# Patient Record
Sex: Female | Born: 1965 | Race: White | Hispanic: No | Marital: Married | Smoking: Former smoker
Health system: Southern US, Community
[De-identification: ages and names within clinical notes are randomized; demographics above are authoritative.]

## PROBLEM LIST (undated history)

## (undated) DIAGNOSIS — R079 Chest pain, unspecified: Secondary | ICD-10-CM

## (undated) DIAGNOSIS — R519 Headache, unspecified: Secondary | ICD-10-CM

## (undated) DIAGNOSIS — R51 Headache: Secondary | ICD-10-CM

## (undated) DIAGNOSIS — E119 Type 2 diabetes mellitus without complications: Secondary | ICD-10-CM

## (undated) DIAGNOSIS — I10 Essential (primary) hypertension: Secondary | ICD-10-CM

## (undated) HISTORY — PX: LAPAROSCOPIC GASTRIC BANDING: SHX1100

## (undated) HISTORY — PX: CHOLECYSTECTOMY: SHX55

---

## 2013-07-04 ENCOUNTER — Other Ambulatory Visit: Payer: Self-pay | Admitting: Family Medicine

## 2013-07-04 DIAGNOSIS — Z803 Family history of malignant neoplasm of breast: Secondary | ICD-10-CM

## 2013-07-04 DIAGNOSIS — Z1231 Encounter for screening mammogram for malignant neoplasm of breast: Secondary | ICD-10-CM

## 2013-07-20 ENCOUNTER — Inpatient Hospital Stay: Admission: RE | Admit: 2013-07-20 | Payer: Self-pay | Source: Ambulatory Visit

## 2013-12-18 ENCOUNTER — Emergency Department (HOSPITAL_COMMUNITY)
Admission: EM | Admit: 2013-12-18 | Discharge: 2013-12-18 | Disposition: A | Discharge status: Home / Self Care | Payer: 59 | Source: Home / Self Care | Attending: Family Medicine | Admitting: Family Medicine

## 2013-12-18 ENCOUNTER — Emergency Department (INDEPENDENT_AMBULATORY_CARE_PROVIDER_SITE_OTHER): Payer: 59

## 2013-12-18 ENCOUNTER — Encounter (HOSPITAL_COMMUNITY): Payer: Self-pay | Admitting: Emergency Medicine

## 2013-12-18 DIAGNOSIS — S60212A Contusion of left wrist, initial encounter: Secondary | ICD-10-CM
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
## 2013-12-18 NOTE — Discharge Instructions (Signed)
Thank you for coming in today. Take up to 2 Aleve twice daily for pain. Use a wrist brace. Followup with your primary care provider orthopedics in one to 2 weeks for recheck and reevaluation.   Wrist Sprain with Rehab A sprain is an injury in which a ligament that maintains the proper alignment of a joint is partially or completely torn. The ligaments of the wrist are susceptible to sprains. Sprains are classified into three categories. Grade 1 sprains cause pain, but the tendon is not lengthened. Grade 2 sprains include a lengthened ligament because the ligament is stretched or partially ruptured. With grade 2 sprains there is still function, although the function may be diminished. Grade 3 sprains are characterized by a complete tear of the tendon or muscle, and function is usually impaired. SYMPTOMS   Pain tenderness, inflammation, and/or bruising (contusion) of the injury.  A "pop" or tear felt and/or heard at the time of injury.  Decreased wrist function. CAUSES  A wrist sprain occurs when a force is placed on one or more ligaments that is greater than it/they can withstand. Common mechanisms of injury include:  Catching a ball with you hands.  Repetitive and/ or strenuous extension or flexion of the wrist. RISK INCREASES WITH:  Previous wrist injury.  Contact sports (boxing or wrestling).  Activities in which falling is common.  Poor strength and flexibility.  Improperly fitted or padded protective equipment. PREVENTION  Warm up and stretch properly before activity.  Allow for adequate recovery between workouts.  Maintain physical fitness:  Strength, flexibility, and endurance.  Cardiovascular fitness.  Protect the wrist joint by limiting its motion with the use of taping, braces, or splints.  Protect the wrist after injury for 6 to 12 months. PROGNOSIS  The prognosis for wrist sprains depends on the degree of injury. Grade 1 sprains require 2 to 6 weeks of  treatment. Grade 2 sprains require 6 to 8 weeks of treatment, and grade 3 sprains require up to 12 weeks.  RELATED COMPLICATIONS   Prolonged healing time, if improperly treated or re-injured.  Recurrent symptoms that result in a chronic problem.  Injury to nearby structures (bone, cartilage, nerves, or tendons).  Arthritis of the wrist.  Inability to compete in athletics at a high level.  Wrist stiffness or weakness.  Progression to a complete rupture of the ligament. TREATMENT  Treatment initially involves resting from any activities that aggravate the symptoms, and the use of ice and medications to help reduce pain and inflammation. Your caregiver may recommend immobilizing the wrist for a period of time in order to reduce stress on the ligament and allow for healing. After immobilization it is important to perform strengthening and stretching exercises to help regain strength and a full range of motion. These exercises may be completed at home or with a therapist. Surgery is not usually required for wrist sprains, unless the ligament has been ruptured (grade 3 sprain). MEDICATION   If pain medication is necessary, then nonsteroidal anti-inflammatory medications, such as aspirin and ibuprofen, or other minor pain relievers, such as acetaminophen, are often recommended.  Do not take pain medication for 7 days before surgery.  Prescription pain relievers may be given if deemed necessary by your caregiver. Use only as directed and only as much as you need. HEAT AND COLD  Cold treatment (icing) relieves pain and reduces inflammation. Cold treatment should be applied for 10 to 15 minutes every 2 to 3 hours for inflammation and pain and immediately after  any activity that aggravates your symptoms. Use ice packs or massage the area with a piece of ice (ice massage).  Heat treatment may be used prior to performing the stretching and strengthening activities prescribed by your caregiver,  physical therapist, or athletic trainer. Use a heat pack or soak your injury in warm water. SEEK MEDICAL CARE IF:  Treatment seems to offer no benefit, or the condition worsens.  Any medications produce adverse side effects. EXERCISES RANGE OF MOTION (ROM) AND STRETCHING EXERCISES - Wrist Sprain  These exercises may help you when beginning to rehabilitate your injury. Your symptoms may resolve with or without further involvement from your physician, physical therapist or athletic trainer. While completing these exercises, remember:   Restoring tissue flexibility helps normal motion to return to the joints. This allows healthier, less painful movement and activity.  An effective stretch should be held for at least 30 seconds.  A stretch should never be painful. You should only feel a gentle lengthening or release in the stretched tissue. RANGE OF MOTION - Wrist Flexion, Active-Assisted  Extend your right / left elbow with your fingers pointing down.*  Gently pull the back of your hand towards you until you feel a gentle stretch on the top of your forearm.  Hold this position for __________ seconds. Repeat __________ times. Complete this exercise __________ times per day.  *If directed by your physician, physical therapist or athletic trainer, complete this stretch with your elbow bent rather than extended. RANGE OF MOTION - Wrist Extension, Active-Assisted  Extend your right / left elbow and turn your palm upwards.*  Gently pull your palm/fingertips back so your wrist extends and your fingers point more toward the ground.  You should feel a gentle stretch on the inside of your forearm.  Hold this position for __________ seconds. Repeat __________ times. Complete this exercise __________ times per day. *If directed by your physician, physical therapist or athletic trainer, complete this stretch with your elbow bent, rather than extended. RANGE OF MOTION - Supination, Active  Stand  or sit with your elbows at your side. Bend your right / left elbow to 90 degrees.  Turn your palm upward until you feel a gentle stretch on the inside of your forearm.  Hold this position for __________ seconds. Slowly release and return to the starting position. Repeat __________ times. Complete this stretch __________ times per day.  RANGE OF MOTION - Pronation, Active  Stand or sit with your elbows at your side. Bend your right / left elbow to 90 degrees.  Turn your palm downward until you feel a gentle stretch on the top of your forearm.  Hold this position for __________ seconds. Slowly release and return to the starting position. Repeat __________ times. Complete this stretch __________ times per day.  STRETCH - Wrist Flexion  Place the back of your right / left hand on a tabletop leaving your elbow slightly bent. Your fingers should point away from your body.  Gently press the back of your hand down onto the table by straightening your elbow. You should feel a stretch on the top of your forearm.  Hold this position for __________ seconds. Repeat __________ times. Complete this stretch __________ times per day.  STRETCH - Wrist Extension  Place your right / left fingertips on a tabletop leaving your elbow slightly bent. Your fingers should point backwards.  Gently press your fingers and palm down onto the table by straightening your elbow. You should feel a stretch on the inside  of your forearm.  Hold this position for __________ seconds. Repeat __________ times. Complete this stretch __________ times per day.  STRENGTHENING EXERCISES - Wrist Sprain These exercises may help you when beginning to rehabilitate your injury. They may resolve your symptoms with or without further involvement from your physician, physical therapist or athletic trainer. While completing these exercises, remember:   Muscles can gain both the endurance and the strength needed for everyday activities  through controlled exercises.  Complete these exercises as instructed by your physician, physical therapist or athletic trainer. Progress with the resistance and repetition exercises only as your caregiver advises. STRENGTH - Wrist Flexors  Sit with your right / left forearm palm-up and fully supported. Your elbow should be resting below the height of your shoulder. Allow your wrist to extend over the edge of the surface.  Loosely holding a __________ weight or a piece of rubber exercise band/tubing, slowly curl your hand up toward your forearm.  Hold this position for __________ seconds. Slowly lower the wrist back to the starting position in a controlled manner. Repeat __________ times. Complete this exercise __________ times per day.  STRENGTH - Wrist Extensors  Sit with your right / left forearm palm-down and fully supported. Your elbow should be resting below the height of your shoulder. Allow your wrist to extend over the edge of the surface.  Loosely holding a __________ weight or a piece of rubber exercise band/tubing, slowly curl your hand up toward your forearm.  Hold this position for __________ seconds. Slowly lower the wrist back to the starting position in a controlled manner. Repeat __________ times. Complete this exercise __________ times per day.  STRENGTH - Ulnar Deviators  Stand with a ____________________ weight in your right / left hand, or sit holding on to the rubber exercise band/tubing with your opposite arm supported.  Move your wrist so that your pinkie travels toward your forearm and your thumb moves away from your forearm.  Hold this position for __________ seconds and then slowly lower the wrist back to the starting position. Repeat __________ times. Complete this exercise __________ times per day STRENGTH - Radial Deviators  Stand with a ____________________ weight in your  right / left hand, or sit holding on to the rubber exercise band/tubing with your  arm supported.  Raise your hand upward in front of you or pull up on the rubber tubing.  Hold this position for __________ seconds and then slowly lower the wrist back to the starting position. Repeat __________ times. Complete this exercise __________ times per day. STRENGTH - Forearm Supinators  Sit with your right / left forearm supported on a table, keeping your elbow below shoulder height. Rest your hand over the edge, palm down.  Gently grip a hammer or a soup ladle.  Without moving your elbow, slowly turn your palm and hand upward to a "thumbs-up" position.  Hold this position for __________ seconds. Slowly return to the starting position. Repeat __________ times. Complete this exercise __________ times per day.  STRENGTH - Forearm Pronators  Sit with your right / left forearm supported on a table, keeping your elbow below shoulder height. Rest your hand over the edge, palm up.  Gently grip a hammer or a soup ladle.  Without moving your elbow, slowly turn your palm and hand upward to a "thumbs-up" position.  Hold this position for __________ seconds. Slowly return to the starting position. Repeat __________ times. Complete this exercise __________ times per day.  STRENGTH - Grip  Grasp a tennis ball, a dense sponge, or a large, rolled sock in your hand.  Squeeze as hard as you can without increasing any pain.  Hold this position for __________ seconds. Release your grip slowly. Repeat __________ times. Complete this exercise __________ times per day.  Document Released: 02/24/2005 Document Revised: 05/19/2011 Document Reviewed: 06/08/2008 [facility] Patient Information 2015 [place], [place]. This information is not intended to replace advice given to you by your health care provider. Make sure you discuss any questions you have with your health care provider.

## 2014-04-13 ENCOUNTER — Other Ambulatory Visit: Payer: Self-pay | Admitting: Obstetrics & Gynecology

## 2014-04-13 ENCOUNTER — Other Ambulatory Visit (HOSPITAL_COMMUNITY)
Admission: RE | Admit: 2014-04-13 | Discharge: 2014-04-13 | Disposition: A | Discharge status: Home / Self Care | Payer: BLUE CROSS/BLUE SHIELD | Source: Ambulatory Visit | Attending: Obstetrics & Gynecology | Admitting: Obstetrics & Gynecology

## 2014-04-13 DIAGNOSIS — Z1151 Encounter for screening for human papillomavirus (HPV): Secondary | ICD-10-CM | POA: Diagnosis present

## 2014-04-13 DIAGNOSIS — Z01411 Encounter for gynecological examination (general) (routine) with abnormal findings: Secondary | ICD-10-CM | POA: Diagnosis present

## 2014-04-18 LAB — CYTOLOGY - PAP

## 2014-04-25 ENCOUNTER — Other Ambulatory Visit: Payer: Self-pay | Admitting: Obstetrics & Gynecology

## 2014-04-26 ENCOUNTER — Ambulatory Visit
Admission: RE | Admit: 2014-04-26 | Discharge: 2014-04-26 | Disposition: A | Discharge status: Home / Self Care | Payer: BLUE CROSS/BLUE SHIELD | Source: Ambulatory Visit | Attending: Family Medicine | Admitting: Family Medicine

## 2014-04-26 ENCOUNTER — Encounter (INDEPENDENT_AMBULATORY_CARE_PROVIDER_SITE_OTHER): Payer: Self-pay

## 2014-04-26 DIAGNOSIS — Z803 Family history of malignant neoplasm of breast: Secondary | ICD-10-CM

## 2014-04-26 DIAGNOSIS — Z1231 Encounter for screening mammogram for malignant neoplasm of breast: Secondary | ICD-10-CM

## 2014-05-28 NOTE — H&P (Signed)
GYN H&P  HPI  49yo [id number] female who presents for total laparoscopic hysterectomy and bilateral salpingectomy due to abnormal uterine bleeding and history of endometrial hyperplasia. In review, the patient was diagnosed with endometrial hyperplasia that was previously managed with a Mirena. However, over the past few months, she has started to have intermentrual bleeding along with heavier menses.  As part of her management, an [place] was performed that revealed a slightly enlarged uterus measuring 11x7.3x7.2cm with 3 small fibroids (anterior fundal 2.5x2.4x2.3 cm, anterior 2.6x2.4x2.1cm, and fundal 2.6x2.6x2.1cm) normal endometrium, normal ovaries bilaterally. Due to concern for infection (Actinomyces noted on pap smear) and inadequate EMB, the Mirena was removed and a repeat EMB showed no hyperplasia or malignancy. Pt has been on progesterone to control her heavy bleeding with only minimal improvement. After much discussions regarding her options, she wishes to proceed with surgical management.   Current Medications  Taking   Laxative(Sennosides) every 2-3 days   Ambien(Zolpidem Tartrate) 10 MG Tablet 1 tablet at bedtime as needed, Notes: rare   Levothyroxine Sodium 112 Tablet take 1 tablet by mouth every morning on an empty stomach   Losartan Potassium 50 MG Tablet 1 tablet Once a day   MedroxyPROGESTERone Acetate 10 MG Tablet 2 tablets Once a day, Notes: 2 pills/day   Iron Tablet 1 tablet Once a day    Past Medical History  Diabetes Type II  Hypothyroidism   Insomnia  Uterine Hyperplasia  Allergic Rhinitis- cats, pollen   Surgical History  lab band surgery 2009 [place]   gallbladder removal [place] History  Father: deceased 51 yrs, Thyroid cancer  Mother: alive, Colon cancer, HTN, DM, heart "event" 2014, diagnosed with DM, HTN, Colon Ca  Paternal Grand Father: deceased, CAD, Hypertension  Paternal [place] Mother: deceased  Maternal Grand Father: deceased, Hypertension;  cancer  Maternal Grand Mother: deceased, Breast cancer  Sister 1: alive, Healthy  Maternal aunt: deceased, diagnosed with Breast Ca  1 sister(s) .   Maternal Great Aunt had Breast Cancer.   Social History  General:  Tobacco use  cigarettes: Former smoker mid 2015 Tobacco history last updated 05/19/2014 no Smoking.  no Alcohol.  Caffeine: yes, coffee daily.  no Recreational drug use.  Marital Status: Married.    Gyn History  Sexual activity not currently sexually active.  Periods : irregular (see above) LMP Irregular bleeding since removal of Mirena.  Last pap smear date 04/13/2014.  Last mammogram date 04/25/2014.  Abnormal pap smear assessed with colposcopy.    OB History  Pregnancy # 1 abortion.  Pregnancy # 2 miscarriage.    Allergies  N.K.D.A.   Review of Systems  CONSTITUTIONAL:  no Chills. no Fever. no Skin rash.  HEENT:  Blurrred vision no.  CARDIOLOGY:  no Chest pain.  RESPIRATORY:  no Shortness of breath. no Cough.  GASTROENTEROLOGY:  no Abdominal pain. no Appetite change. no Change in bowel movements.  FEMALE REPRODUCTIVE:  no Breast lumps or discharge. no Breast pain. no Unusual vaginal discharge. no Vaginal irritation. no Vaginal itching.  NEUROLOGY:  no Dizziness. no Headache.   Examination (performed in office on 05/23/14)  General Examination: GENERAL APPEARANCE alert, oriented, NAD, pleasant.  SKIN: normal, no rash.  LUNGS: clear to auscultation bilaterally, no wheezes, rhonchi, rales.  HEART: no murmurs, regular rate and rhythm.  ABDOMEN: obese, soft and non-tender, no rebound, no guarding.  FEMALE GENITOURINARY: No external lesions, Vagina - pink moist mucosa, no lesions or abnormal discharge, cervix - visualized,  no discharge or lesions, small clot at cervical os, No CMT. No adnexal masses bilaterally. Uterus: nontender and normal size on palpation.  EXTREMITIES: no edema present, no calf tenderness bilaterally.   A/P: [id number] who  presents for total laparoscopic hysterectomy and bilateral salpingectomy- possible open procedure -NPO -LR @ 125cc/hr -CBC, BMP to be completed -EKG to be performed prior to surgery -SCDs to OR -Cefotan IV to OR -Risk and benefits reviewed, questions and concerns were addressed  Janyth Pupa, DO [phone] (pager) [phone] (office)

## 2014-05-29 ENCOUNTER — Other Ambulatory Visit: Payer: Self-pay

## 2014-05-29 ENCOUNTER — Encounter (HOSPITAL_COMMUNITY)
Admission: RE | Admit: 2014-05-29 | Discharge: 2014-05-29 | Disposition: A | Discharge status: Home / Self Care | Payer: BLUE CROSS/BLUE SHIELD | Source: Ambulatory Visit | Attending: Obstetrics & Gynecology | Admitting: Obstetrics & Gynecology

## 2014-05-29 ENCOUNTER — Encounter (HOSPITAL_COMMUNITY): Payer: Self-pay

## 2014-05-29 DIAGNOSIS — E119 Type 2 diabetes mellitus without complications: Secondary | ICD-10-CM | POA: Diagnosis not present

## 2014-05-29 DIAGNOSIS — I10 Essential (primary) hypertension: Secondary | ICD-10-CM | POA: Diagnosis not present

## 2014-05-29 DIAGNOSIS — Z800 Family history of malignant neoplasm of digestive organs: Secondary | ICD-10-CM | POA: Diagnosis not present

## 2014-05-29 DIAGNOSIS — F17210 Nicotine dependence, cigarettes, uncomplicated: Secondary | ICD-10-CM | POA: Diagnosis not present

## 2014-05-29 DIAGNOSIS — E039 Hypothyroidism, unspecified: Secondary | ICD-10-CM | POA: Diagnosis not present

## 2014-05-29 DIAGNOSIS — D259 Leiomyoma of uterus, unspecified: Secondary | ICD-10-CM | POA: Diagnosis present

## 2014-05-29 DIAGNOSIS — E669 Obesity, unspecified: Secondary | ICD-10-CM | POA: Diagnosis not present

## 2014-05-29 DIAGNOSIS — Z6835 Body mass index (BMI) 35.0-35.9, adult: Secondary | ICD-10-CM | POA: Diagnosis not present

## 2014-05-29 HISTORY — DX: Type 2 diabetes mellitus without complications: E11.9

## 2014-05-29 HISTORY — DX: Essential (primary) hypertension: I10

## 2014-05-29 HISTORY — DX: Headache, unspecified: R51.9

## 2014-05-29 HISTORY — DX: Headache: R51

## 2014-05-29 LAB — CBC
HEMATOCRIT: 34.2 % — AB (ref 36.0–46.0)
Hemoglobin: 11.5 g/dL — ABNORMAL LOW (ref 12.0–15.0)
MCH: 28.1 pg (ref 26.0–34.0)
MCHC: 33.6 g/dL (ref 30.0–36.0)
MCV: 83.6 fL (ref 78.0–100.0)
Platelets: 340 10*3/uL (ref 150–400)
RBC: 4.09 MIL/uL (ref 3.87–5.11)
RDW: 13.9 % (ref 11.5–15.5)
WBC: 7.5 10*3/uL (ref 4.0–10.5)

## 2014-05-29 LAB — BASIC METABOLIC PANEL
Anion gap: 9 (ref 5–15)
BUN: 9 mg/dL (ref 6–23)
CHLORIDE: 104 mmol/L (ref 96–112)
CO2: 22 mmol/L (ref 19–32)
CREATININE: 0.74 mg/dL (ref 0.50–1.10)
Calcium: 9 mg/dL (ref 8.4–10.5)
GFR calc Af Amer: >90 mL/min (ref >90)
Glucose, Bld: 139 mg/dL — ABNORMAL HIGH (ref 70–99)
Potassium: 3.6 mmol/L (ref 3.5–5.1)
Sodium: 135 mmol/L (ref 135–145)

## 2014-05-29 LAB — TYPE AND SCREEN
ABO/RH(D): A POS
ANTIBODY SCREEN: NEGATIVE

## 2014-05-29 LAB — ABO/RH: ABO/RH(D): A POS

## 2014-05-29 NOTE — Patient Instructions (Addendum)
   Your procedure is scheduled on: MARCH 23 AT 730AM  Enter through the Main Entrance of [facility] at: 6AM  Pick up the phone at the desk and dial [phone] and inform us of your arrival.  Please call this number if you have any problems the morning of surgery: [phone]  Remember: Do not eat food after midnight: Do not drink clear liquids after: Take these medicines the morning of surgery with a SIP OF WATER: TAKE SYNTHROID DAY OF SURGERY AND COZAR   Do not wear jewelry, make-up, or FINGER nail polish No metal in your hair or on your body. Do not wear lotions, powders, perfumes.  You may wear deodorant.  Do not bring valuables to the hospital. Contacts, dentures or bridgework may not be worn into surgery.  Leave suitcase in the car. After Surgery it may be brought to your room. For patients being admitted to the hospital, checkout time is 11:00am the day of discharge.    Patients discharged on the day of surgery will not be allowed to drive home.

## 2014-05-30 MED ORDER — DEXTROSE 5 % IV SOLN
2.0000 g | INTRAVENOUS | Status: AC
  Administered 2014-05-31: 2 g via INTRAVENOUS
  Filled 2014-05-30: qty 2

## 2014-05-31 ENCOUNTER — Observation Stay (HOSPITAL_COMMUNITY)
Admission: RE | Admit: 2014-05-31 | Discharge: 2014-06-01 | Disposition: A | Discharge status: Home / Self Care | Payer: BLUE CROSS/BLUE SHIELD | Source: Ambulatory Visit | Attending: Obstetrics & Gynecology | Admitting: Obstetrics & Gynecology

## 2014-05-31 ENCOUNTER — Encounter (HOSPITAL_COMMUNITY)
Admission: RE | Disposition: A | Discharge status: Home / Self Care | Payer: Self-pay | Source: Ambulatory Visit | Attending: Obstetrics & Gynecology

## 2014-05-31 ENCOUNTER — Ambulatory Visit (HOSPITAL_COMMUNITY): Payer: BLUE CROSS/BLUE SHIELD | Admitting: Anesthesiology

## 2014-05-31 ENCOUNTER — Encounter (HOSPITAL_COMMUNITY): Payer: Self-pay | Admitting: Certified Registered Nurse Anesthetist

## 2014-05-31 DIAGNOSIS — N939 Abnormal uterine and vaginal bleeding, unspecified: Secondary | ICD-10-CM | POA: Diagnosis present

## 2014-05-31 DIAGNOSIS — F17210 Nicotine dependence, cigarettes, uncomplicated: Secondary | ICD-10-CM | POA: Insufficient documentation

## 2014-05-31 DIAGNOSIS — E669 Obesity, unspecified: Secondary | ICD-10-CM | POA: Insufficient documentation

## 2014-05-31 DIAGNOSIS — E119 Type 2 diabetes mellitus without complications: Secondary | ICD-10-CM | POA: Insufficient documentation

## 2014-05-31 DIAGNOSIS — D259 Leiomyoma of uterus, unspecified: Principal | ICD-10-CM | POA: Insufficient documentation

## 2014-05-31 DIAGNOSIS — I10 Essential (primary) hypertension: Secondary | ICD-10-CM | POA: Insufficient documentation

## 2014-05-31 DIAGNOSIS — E039 Hypothyroidism, unspecified: Secondary | ICD-10-CM | POA: Insufficient documentation

## 2014-05-31 DIAGNOSIS — Z6835 Body mass index (BMI) 35.0-35.9, adult: Secondary | ICD-10-CM | POA: Insufficient documentation

## 2014-05-31 DIAGNOSIS — Z800 Family history of malignant neoplasm of digestive organs: Secondary | ICD-10-CM | POA: Insufficient documentation

## 2014-05-31 HISTORY — PX: LAPAROSCOPIC BILATERAL SALPINGECTOMY: SHX5889

## 2014-05-31 HISTORY — PX: LAPAROSCOPIC HYSTERECTOMY: SHX1926

## 2014-05-31 LAB — CBC
HCT: 28.9 % — ABNORMAL LOW (ref 36.0–46.0)
Hemoglobin: 9.7 g/dL — ABNORMAL LOW (ref 12.0–15.0)
MCH: 28.6 pg (ref 26.0–34.0)
MCHC: 33.6 g/dL (ref 30.0–36.0)
MCV: 85.3 fL (ref 78.0–100.0)
Platelets: 288 10*3/uL (ref 150–400)
RBC: 3.39 MIL/uL — ABNORMAL LOW (ref 3.87–5.11)
RDW: 13.9 % (ref 11.5–15.5)
WBC: 11.1 10*3/uL — ABNORMAL HIGH (ref 4.0–10.5)

## 2014-05-31 LAB — GLUCOSE, CAPILLARY
GLUCOSE-CAPILLARY: 144 mg/dL — AB (ref 70–99)
Glucose-Capillary: 107 mg/dL — ABNORMAL HIGH (ref 70–99)

## 2014-05-31 SURGERY — Surgical Case
Anesthesia: *Unknown

## 2014-05-31 SURGERY — HYSTERECTOMY, TOTAL, LAPAROSCOPIC
Anesthesia: General | Site: Abdomen

## 2014-05-31 MED ORDER — LACTATED RINGERS IV SOLN
INTRAVENOUS | Status: DC
  Administered 2014-05-31 (×2): via INTRAVENOUS

## 2014-05-31 MED ORDER — HYDROMORPHONE HCL 1 MG/ML IJ SOLN: 0.2000 mg | INTRAMUSCULAR | Status: DC | PRN

## 2014-05-31 MED ORDER — ROCURONIUM BROMIDE 100 MG/10ML IV SOLN
INTRAVENOUS | Status: AC
  Filled 2014-05-31: qty 1

## 2014-05-31 MED ORDER — FENTANYL CITRATE 0.05 MG/ML IJ SOLN
INTRAMUSCULAR | Status: AC
  Filled 2014-05-31: qty 5

## 2014-05-31 MED ORDER — EPHEDRINE SULFATE 50 MG/ML IJ SOLN
INTRAMUSCULAR | Status: DC | PRN
  Administered 2014-05-31 (×2): 10 mg via INTRAVENOUS

## 2014-05-31 MED ORDER — LABETALOL HCL 5 MG/ML IV SOLN
INTRAVENOUS | Status: DC | PRN
  Administered 2014-05-31 (×4): 2.5 mg via INTRAVENOUS

## 2014-05-31 MED ORDER — ONDANSETRON HCL 4 MG PO TABS: 4.0000 mg | ORAL_TABLET | Freq: Four times a day (QID) | ORAL | Status: DC | PRN

## 2014-05-31 MED ORDER — ROCURONIUM BROMIDE 100 MG/10ML IV SOLN
INTRAVENOUS | Status: DC | PRN
  Administered 2014-05-31: 50 mg via INTRAVENOUS
  Administered 2014-05-31 (×6): 10 mg via INTRAVENOUS

## 2014-05-31 MED ORDER — FENTANYL CITRATE 0.05 MG/ML IJ SOLN
INTRAMUSCULAR | Status: DC | PRN
  Administered 2014-05-31 (×2): 50 ug via INTRAVENOUS
  Administered 2014-05-31 (×2): 100 ug via INTRAVENOUS
  Administered 2014-05-31: 50 ug via INTRAVENOUS
  Administered 2014-05-31: 100 ug via INTRAVENOUS
  Administered 2014-05-31: 50 ug via INTRAVENOUS

## 2014-05-31 MED ORDER — SIMETHICONE 80 MG PO CHEW: 80.0000 mg | CHEWABLE_TABLET | Freq: Four times a day (QID) | ORAL | Status: DC | PRN

## 2014-05-31 MED ORDER — ACETAMINOPHEN 160 MG/5ML PO SOLN: 325.0000 mg | ORAL | Status: DC | PRN

## 2014-05-31 MED ORDER — NEOSTIGMINE METHYLSULFATE 10 MG/10ML IV SOLN
INTRAVENOUS | Status: AC
  Filled 2014-05-31: qty 1

## 2014-05-31 MED ORDER — LABETALOL HCL 5 MG/ML IV SOLN
INTRAVENOUS | Status: AC
  Filled 2014-05-31: qty 4

## 2014-05-31 MED ORDER — ONDANSETRON HCL 4 MG/2ML IJ SOLN: 4.0000 mg | Freq: Four times a day (QID) | INTRAMUSCULAR | Status: DC | PRN

## 2014-05-31 MED ORDER — ONDANSETRON HCL 4 MG/2ML IJ SOLN
INTRAMUSCULAR | Status: AC
  Filled 2014-05-31: qty 2

## 2014-05-31 MED ORDER — ROCURONIUM BROMIDE 100 MG/10ML IV SOLN
INTRAVENOUS | Status: AC
Start: 2014-05-31 — End: 2014-05-31
  Filled 2014-05-31: qty 1

## 2014-05-31 MED ORDER — LACTATED RINGERS IV SOLN
INTRAVENOUS | Status: DC
  Administered 2014-05-31 (×4): via INTRAVENOUS

## 2014-05-31 MED ORDER — PNEUMOCOCCAL VAC POLYVALENT 25 MCG/0.5ML IJ INJ
0.5000 mL | INJECTION | INTRAMUSCULAR | Status: DC
  Filled 2014-05-31: qty 0.5

## 2014-05-31 MED ORDER — LEVOTHYROXINE SODIUM 112 MCG PO TABS
112.0000 ug | ORAL_TABLET | Freq: Every day | ORAL | Status: DC
  Administered 2014-06-01: 112 ug via ORAL
  Filled 2014-05-31: qty 1

## 2014-05-31 MED ORDER — ESTRADIOL 0.1 MG/GM VA CREA
TOPICAL_CREAM | VAGINAL | Status: AC
  Filled 2014-05-31: qty 42.5

## 2014-05-31 MED ORDER — MEPERIDINE HCL 25 MG/ML IJ SOLN: 6.2500 mg | INTRAMUSCULAR | Status: DC | PRN

## 2014-05-31 MED ORDER — SCOPOLAMINE 1 MG/3DAYS TD PT72
MEDICATED_PATCH | TRANSDERMAL | Status: AC
  Administered 2014-05-31: 1.5 mg via TRANSDERMAL
  Filled 2014-05-31: qty 1

## 2014-05-31 MED ORDER — DOCUSATE SODIUM 100 MG PO CAPS
100.0000 mg | ORAL_CAPSULE | Freq: Two times a day (BID) | ORAL | Status: DC
  Administered 2014-05-31 – 2014-06-01 (×2): 100 mg via ORAL
  Filled 2014-05-31 (×2): qty 1

## 2014-05-31 MED ORDER — FENTANYL CITRATE 0.05 MG/ML IJ SOLN: 25.0000 ug | INTRAMUSCULAR | Status: DC | PRN

## 2014-05-31 MED ORDER — HYDROMORPHONE HCL 1 MG/ML IJ SOLN
INTRAMUSCULAR | Status: AC
  Filled 2014-05-31: qty 1

## 2014-05-31 MED ORDER — ONDANSETRON HCL 4 MG/2ML IJ SOLN
INTRAMUSCULAR | Status: DC | PRN
  Administered 2014-05-31: 4 mg via INTRAVENOUS

## 2014-05-31 MED ORDER — SCOPOLAMINE 1 MG/3DAYS TD PT72
1.0000 | MEDICATED_PATCH | Freq: Once | TRANSDERMAL | Status: DC
  Administered 2014-05-31: 1.5 mg via TRANSDERMAL

## 2014-05-31 MED ORDER — MENTHOL 3 MG MT LOZG: 1.0000 | LOZENGE | OROMUCOSAL | Status: DC | PRN

## 2014-05-31 MED ORDER — DEXAMETHASONE SODIUM PHOSPHATE 10 MG/ML IJ SOLN
INTRAMUSCULAR | Status: DC | PRN
  Administered 2014-05-31: 4 mg via INTRAVENOUS

## 2014-05-31 MED ORDER — PROPOFOL 10 MG/ML IV BOLUS
INTRAVENOUS | Status: AC
Start: 2014-05-31 — End: 2014-05-31
  Filled 2014-05-31: qty 20

## 2014-05-31 MED ORDER — NEOSTIGMINE METHYLSULFATE 10 MG/10ML IV SOLN
INTRAVENOUS | Status: DC | PRN
  Administered 2014-05-31: 2.5 mg via INTRAVENOUS

## 2014-05-31 MED ORDER — BUPIVACAINE HCL (PF) 0.25 % IJ SOLN
INTRAMUSCULAR | Status: AC
  Filled 2014-05-31: qty 30

## 2014-05-31 MED ORDER — ESTROGENS, CONJUGATED 0.625 MG/GM VA CREA
TOPICAL_CREAM | VAGINAL | Status: DC | PRN
  Administered 2014-05-31: 1 via VAGINAL

## 2014-05-31 MED ORDER — LIDOCAINE HCL (CARDIAC) 20 MG/ML IV SOLN
INTRAVENOUS | Status: AC
  Filled 2014-05-31: qty 5

## 2014-05-31 MED ORDER — LACTATED RINGERS IR SOLN
Status: DC | PRN
  Administered 2014-05-31: 3000 mL

## 2014-05-31 MED ORDER — MIDAZOLAM HCL 2 MG/2ML IJ SOLN: 0.5000 mg | Freq: Once | INTRAMUSCULAR | Status: DC | PRN

## 2014-05-31 MED ORDER — OXYCODONE-ACETAMINOPHEN 5-325 MG PO TABS: 1.0000 | ORAL_TABLET | ORAL | Status: DC | PRN

## 2014-05-31 MED ORDER — ALUM & MAG HYDROXIDE-SIMETH 200-200-20 MG/5ML PO SUSP: 30.0000 mL | ORAL | Status: DC | PRN

## 2014-05-31 MED ORDER — KETOROLAC TROMETHAMINE 30 MG/ML IJ SOLN
30.0000 mg | Freq: Four times a day (QID) | INTRAMUSCULAR | Status: DC | PRN
  Administered 2014-05-31 – 2014-06-01 (×2): 30 mg via INTRAVENOUS
  Filled 2014-05-31 (×2): qty 1

## 2014-05-31 MED ORDER — GLYCOPYRROLATE 0.2 MG/ML IJ SOLN
INTRAMUSCULAR | Status: AC
  Filled 2014-05-31: qty 1

## 2014-05-31 MED ORDER — BISACODYL 5 MG PO TBEC
5.0000 mg | DELAYED_RELEASE_TABLET | Freq: Every day | ORAL | Status: DC | PRN
  Administered 2014-06-01: 5 mg via ORAL
  Filled 2014-05-31 (×2): qty 1

## 2014-05-31 MED ORDER — LACTATED RINGERS IV SOLN
INTRAVENOUS | Status: DC
  Administered 2014-05-31: 06:00:00 via INTRAVENOUS

## 2014-05-31 MED ORDER — LIDOCAINE HCL (CARDIAC) 20 MG/ML IV SOLN
INTRAVENOUS | Status: DC | PRN
  Administered 2014-05-31: 80 mg via INTRAVENOUS

## 2014-05-31 MED ORDER — GLYCOPYRROLATE 0.2 MG/ML IJ SOLN
INTRAMUSCULAR | Status: AC
  Filled 2014-05-31: qty 3

## 2014-05-31 MED ORDER — GLYCOPYRROLATE 0.2 MG/ML IJ SOLN
INTRAMUSCULAR | Status: DC | PRN
  Administered 2014-05-31: 0.4 mg via INTRAVENOUS

## 2014-05-31 MED ORDER — KETOROLAC TROMETHAMINE 30 MG/ML IJ SOLN: 30.0000 mg | Freq: Once | INTRAMUSCULAR | Status: DC | PRN

## 2014-05-31 MED ORDER — ACETAMINOPHEN 325 MG PO TABS: 325.0000 mg | ORAL_TABLET | ORAL | Status: DC | PRN

## 2014-05-31 MED ORDER — ZOLPIDEM TARTRATE 5 MG PO TABS: 5.0000 mg | ORAL_TABLET | Freq: Every evening | ORAL | Status: DC | PRN

## 2014-05-31 MED ORDER — PANTOPRAZOLE SODIUM 40 MG IV SOLR
40.0000 mg | Freq: Every day | INTRAVENOUS | Status: DC
  Administered 2014-05-31: 40 mg via INTRAVENOUS
  Filled 2014-05-31: qty 40

## 2014-05-31 MED ORDER — BUPIVACAINE HCL (PF) 0.25 % IJ SOLN
INTRAMUSCULAR | Status: DC | PRN
  Administered 2014-05-31: 20 mL

## 2014-05-31 MED ORDER — EPHEDRINE 5 MG/ML INJ
INTRAVENOUS | Status: AC
  Filled 2014-05-31: qty 10

## 2014-05-31 MED ORDER — MIDAZOLAM HCL 2 MG/2ML IJ SOLN
INTRAMUSCULAR | Status: AC
  Filled 2014-05-31: qty 2

## 2014-05-31 MED ORDER — MIDAZOLAM HCL 2 MG/2ML IJ SOLN
INTRAMUSCULAR | Status: DC | PRN
  Administered 2014-05-31: 2 mg via INTRAVENOUS

## 2014-05-31 MED ORDER — PROPOFOL 10 MG/ML IV BOLUS
INTRAVENOUS | Status: DC | PRN
Start: 2014-05-31 — End: 2014-05-31
  Administered 2014-05-31: 200 mg via INTRAVENOUS

## 2014-05-31 MED ORDER — KETOROLAC TROMETHAMINE 30 MG/ML IJ SOLN
INTRAMUSCULAR | Status: AC
  Filled 2014-05-31: qty 1

## 2014-05-31 MED ORDER — PROMETHAZINE HCL 25 MG/ML IJ SOLN: 6.2500 mg | INTRAMUSCULAR | Status: DC | PRN

## 2014-05-31 MED ORDER — HYDROMORPHONE HCL 1 MG/ML IJ SOLN
INTRAMUSCULAR | Status: DC | PRN
  Administered 2014-05-31 (×4): 0.5 mg via INTRAVENOUS

## 2014-05-31 MED ORDER — DEXAMETHASONE SODIUM PHOSPHATE 4 MG/ML IJ SOLN
INTRAMUSCULAR | Status: AC
  Filled 2014-05-31: qty 1

## 2014-05-31 SURGICAL SUPPLY — 48 items
APPLICATOR SURGIFLO (MISCELLANEOUS) ×4 IMPLANT
BLADE SURG 10 STRL SS (BLADE) ×8 IMPLANT
CABLE HIGH FREQUENCY MONO STRZ (ELECTRODE) IMPLANT
CLOSURE WOUND 1/4X4 (GAUZE/BANDAGES/DRESSINGS)
CLOTH BEACON ORANGE TIMEOUT ST (SAFETY) ×4 IMPLANT
DISSECTOR BLUNT TIP ENDO 5MM (MISCELLANEOUS) ×4 IMPLANT
DRESSING OPSITE X SMALL 2X3 (GAUZE/BANDAGES/DRESSINGS) ×4 IMPLANT
DRSG COVADERM PLUS 2X2 (GAUZE/BANDAGES/DRESSINGS) ×8 IMPLANT
DRSG OPSITE POSTOP 3X4 (GAUZE/BANDAGES/DRESSINGS) IMPLANT
DURAPREP 26ML APPLICATOR (WOUND CARE) ×8 IMPLANT
ELECT LIGASURE SHORT 9 REUSE (ELECTRODE) IMPLANT
FILTER SMOKE EVAC LAPAROSHD (FILTER) ×4 IMPLANT
GAUZE PACKING 2X5 YD STRL (GAUZE/BANDAGES/DRESSINGS) ×4 IMPLANT
GLOVE BIOGEL PI IND STRL 6.5 (GLOVE) ×8 IMPLANT
GLOVE BIOGEL PI INDICATOR 6.5 (GLOVE) ×8
GLOVE ECLIPSE 6.5 STRL STRAW (GLOVE) ×16 IMPLANT
GOWN STRL REUS W/TWL LRG LVL3 (GOWN DISPOSABLE) ×28 IMPLANT
LIQUID BAND (GAUZE/BANDAGES/DRESSINGS) ×4 IMPLANT
NEEDLE INSUFFLATION 120MM (ENDOMECHANICALS) IMPLANT
OCCLUDER COLPOPNEUMO (BALLOONS) ×4 IMPLANT
PACK LAPAROSCOPY BASIN (CUSTOM PROCEDURE TRAY) ×4 IMPLANT
PACK LAVH (CUSTOM PROCEDURE TRAY) ×4 IMPLANT
PAD OB MATERNITY 4.3X12.25 (PERSONAL CARE ITEMS) ×4 IMPLANT
PAD POSITIONER PINK NONSTERILE (MISCELLANEOUS) ×4 IMPLANT
POUCH SPECIMEN RETRIEVAL 10MM (ENDOMECHANICALS) IMPLANT
PROTECTOR NERVE ULNAR (MISCELLANEOUS) ×4 IMPLANT
SET IRRIG TUBING LAPAROSCOPIC (IRRIGATION / IRRIGATOR) ×8 IMPLANT
SHEARS HARMONIC ACE PLUS 36CM (ENDOMECHANICALS) ×4 IMPLANT
SLEEVE XCEL OPT CAN 5 100 (ENDOMECHANICALS) ×8 IMPLANT
STRIP CLOSURE SKIN 1/4X4 (GAUZE/BANDAGES/DRESSINGS) IMPLANT
SURGIFLO W/THROMBIN 8M KIT (HEMOSTASIS) ×4 IMPLANT
SUT MON AB 4-0 PS1 27 (SUTURE) ×4 IMPLANT
SUT VIC AB 0 CT1 18XCR BRD8 (SUTURE) ×2 IMPLANT
SUT VIC AB 0 CT1 27 (SUTURE) ×4
SUT VIC AB 0 CT1 27XBRD ANBCTR (SUTURE) ×4 IMPLANT
SUT VIC AB 0 CT1 36 (SUTURE) ×8 IMPLANT
SUT VIC AB 0 CT1 8-18 (SUTURE) ×2
SUT VICRYL 0 UR6 27IN ABS (SUTURE) ×4 IMPLANT
SYRINGE 60CC LL (MISCELLANEOUS) ×8 IMPLANT
TIP UTERINE 5.1X6CM LAV DISP (MISCELLANEOUS) IMPLANT
TIP UTERINE 6.7X10CM GRN DISP (MISCELLANEOUS) ×4 IMPLANT
TIP UTERINE 6.7X6CM WHT DISP (MISCELLANEOUS) IMPLANT
TIP UTERINE 6.7X8CM BLUE DISP (MISCELLANEOUS) IMPLANT
TOWEL OR 17X24 6PK STRL BLUE (TOWEL DISPOSABLE) ×20 IMPLANT
TRAY FOLEY CATH 14FR (SET/KITS/TRAYS/PACK) ×4 IMPLANT
TROCAR XCEL NON-BLD 11X100MML (ENDOMECHANICALS) ×4 IMPLANT
TROCAR XCEL NON-BLD 5MMX100MML (ENDOMECHANICALS) ×4 IMPLANT
WARMER LAPAROSCOPE (MISCELLANEOUS) ×4 IMPLANT

## 2014-05-31 NOTE — Anesthesia Preprocedure Evaluation (Signed)
Anesthesia Evaluation  Patient identified by MRN, date of birth, ID band Patient awake    Reviewed: Allergy & Precautions, H&P , Patient's Chart, lab work & pertinent test results, reviewed documented beta blocker date and time   History of Anesthesia Complications Negative for: history of anesthetic complications  Airway Mallampati: III  TM Distance: >3 FB Neck ROM: full    Dental   Pulmonary former smoker,  breath sounds clear to auscultation        Cardiovascular Exercise Tolerance: Good hypertension, Rhythm:regular Rate:Normal     Neuro/Psych  Headaches,    GI/Hepatic   Endo/Other  diabetesMorbid obesity  Renal/GU      Musculoskeletal   Abdominal   Peds  Hematology   Anesthesia Other Findings   Reproductive/Obstetrics                             Anesthesia Physical Anesthesia Plan  ASA: III  Anesthesia Plan: General ETT   Post-op Pain Management:    Induction:   Airway Management Planned:   Additional Equipment:   Intra-op Plan:   Post-operative Plan:   Informed Consent: I have reviewed the patients History and Physical, chart, labs and discussed the procedure including the risks, benefits and alternatives for the proposed anesthesia with the patient or authorized representative who has indicated his/her understanding and acceptance.   Dental Advisory Given  Plan Discussed with: CRNA and Surgeon  Anesthesia Plan Comments:         Anesthesia Quick Evaluation

## 2014-05-31 NOTE — Progress Notes (Signed)
Ur chart review completed.  

## 2014-05-31 NOTE — OR Nursing (Signed)
cbg 205, pt assymptomatic. Reported to Dr. Lyndle Herrlich, no orders received. Darin Engels rn

## 2014-05-31 NOTE — Anesthesia Postprocedure Evaluation (Signed)
  Anesthesia Post-op Note  Patient: Sabrina Rose  Procedure(s) Performed: Procedure(s): HYSTERECTOMY TOTAL LAPAROSCOPIC (N/A) LAPAROSCOPIC BILATERAL SALPINGECTOMY (Bilateral) Patient is awake and responsive. Pain and nausea are reasonably well controlled. Vital signs are stable and clinically acceptable. Oxygen saturation is clinically acceptable. There are no apparent anesthetic complications at this time. Patient is ready for discharge.

## 2014-05-31 NOTE — Interval H&P Note (Signed)
History and Physical Interval Note:  05/31/2014 6:50 AM  Sabrina Rose  has presented today for surgery, with the diagnosis of N93.9 AUB D25.9 Fibroids  The various methods of treatment have been discussed with the patient and family. After consideration of risks, benefits and other options for treatment, the patient has consented to  Procedure(s): HYSTERECTOMY TOTAL LAPAROSCOPIC (N/A) LAPAROSCOPIC BILATERAL SALPINGECTOMY (Bilateral) as a surgical intervention .  The patient's history has been reviewed, patient examined, no change in status, stable for surgery.  I have reviewed the patient's chart and labs.  Questions were answered to the patient's satisfaction.     Janyth Pupa, M

## 2014-05-31 NOTE — Anesthesia Postprocedure Evaluation (Signed)
Anesthesia Post Note  Patient: Sabrina Rose  Procedure(s) Performed: Procedure(s) (LRB): HYSTERECTOMY TOTAL LAPAROSCOPIC (N/A) LAPAROSCOPIC BILATERAL SALPINGECTOMY (Bilateral)  Anesthesia type: General  Patient location: [facility] Unit  Post pain: Pain level controlled  Post assessment: Post-op Vital signs reviewed  Last Vitals:  Filed Vitals:   05/31/14 1751  BP: 107/68  Pulse: 75  Temp: 36.8 C  Resp: 20    Post vital signs: Reviewed  Level of consciousness: sedated  Complications: No apparent anesthesia complications

## 2014-05-31 NOTE — Anesthesia Procedure Notes (Signed)
Procedure Name: Intubation Date/Time: 05/31/2014 7:31 AM Performed by: Raenette Rover Pre-anesthesia Checklist: Patient identified, Emergency Drugs available, Suction available and Patient being monitored Patient Re-evaluated:Patient Re-evaluated prior to inductionOxygen Delivery Method: Circle system utilized Preoxygenation: Pre-oxygenation with 100% oxygen Intubation Type: IV induction Ventilation: Mask ventilation without difficulty and Oral airway inserted - appropriate to patient size Laryngoscope Size: Sabra Heck and 3 Grade View: Grade I Tube type: Oral Tube size: 7.0 mm Number of attempts: 1 Airway Equipment and Method: Stylet Placement Confirmation: ETT inserted through vocal cords under direct vision,  positive ETCO2,  CO2 detector and breath sounds checked- equal and bilateral Secured at: 21 cm Tube secured with: Tape Dental Injury: Teeth and Oropharynx as per pre-operative assessment

## 2014-05-31 NOTE — Op Note (Signed)
OPERATIVE NOTE  Sabrina Rose  DOB:    1965-07-27  MRN:    [id number]  CSN:    [id number]  Date of Surgery:  05/31/2014  Preoperative Diagnosis: Abnormal uterine bleeding, uterine fibroids, h/o endometrial hyperplasia Postoperative Diagnosis: same  Procedure: Total laparoscopic hysterectomy, bilateral salpingectomy Surgeon: Dr. Janyth Pupa  Assistant: Dr. Christophe Louis  Anesthetic: General  Findings:  Enlarged 12wk sized boggy appearing uterus, normal fallopian tubes and ovaries bilaterally.  Minimal bowel adhesions noted in the LLQ to the pelvic side wall.   Uterine weight: 317.2g  EBL: 600cc IVF: 3400cc UOP: 650cc  Procedure:  The patient was taken to the operating room where a general anesthetic was given. The patient's abdomen was prepped with ChloraPrep. The perineum and vagina were prepped with multiple layers of Betadine. A Foley catheter was placed in the bladder.  A sterile speculum was placed.  The anterior lip of the cervix was grasped with a tenaculum.  The uterus was sounded to 12cm.  A 2-0 vicryl was placed at the 12 o'clock position. The RUMI manipulator was placed using the stitch for additional traction.  The patient was draped in a sterile fashion. The infraumbilical area was injected with half percent Marcaine. An incision was made and the Veress needle was inserted into the abdominal cavity without difficulty. Proper placement was confirmed using the saline drop test. A pneumoperitoneum was obtained and opening pressure was 68mmHg. The laparoscopic trocar and the laparoscope were placed under direct visualization and intra-peritoneum placement was confirmed.  Two additional ports were placed in the right and left lower quadrants.  Each area was injected with half percent Marcaine. A small incision was made and a 5 mm trocar was inserted into the abdominal cavity under direct visualization. Pictures taken and findings as described above.  The left round ligament was dissected  using the Harmonic. The left fallopian tube was identified and followed to its fimbriated end and serial ligation of the tube was performed using the Harmonic up to the cornua and removed at that time.  The left utero-ovarian ligament was cauterized and cut.  The anterior leaf of the broad ligament dissected and ligated thereby starting to create the bladder flap anteriorly.  Attention was turned to the right side.  In a similar fashion, the right round ligament was identified and cauterized using the Harmonic.  The right fallopian tube was idenitified and serial ligation was performed from the fimbriated end up to the cornua.  The right utero-ovarian ligament was cauterized and cute.  The anterior leaf of the broad ligament was dissected with the harmonic and the bladder was dissected off the lower uterine segment of the cervix via sharp and blunt dissection.  Bleeding was noted from the right portion of the lower uterine segment- hemostasis was obtained using both the Harmonic and Kleppinger.  Suction/irrigation was used and hemostasis was adequate.  Attention was turned to the left uterine artery, which was skeletonized with the Harmonic, identified and coagulated using both the Harmonic and the Kleppinger forceps.  Additional bleeding was noted and hemostasis obtained using the Kleppinger.  In a similar fashion, the right uterine artery was ligated. The RUMI was palpated and using the Harmonic and the anterior colpotomy was performed.  Using the harmonic a circumferential incision was made around the RUMI using the Harmonic.   Attention was turned vaginally.  Removal of the uterus was attempted, but due to it's large size, the uterus and right fallopian tube were removed in pieces.  Using a scalpel, portions of the uterus were cut and excised in a serial fashion until the remaining portion of the uterus could be delivered vaginally.  A tear was noted along the posterior vaginal wall and repaired using 0  vicryl in a running locked fashion.  The vaginal cuff was closed in a running locked fashion.   Gown and gloves were changed, the pneumoperitoneum was reestablished and attention was turned back to the abdomen.  The pelvis was copiously irrigated and hemostasis was adequate.  Surgicel was placed over the adnexal regions and vaginal cuff.  The 69GE umbilical fascial incision was closed using the fascial closure device under direct visualization using 0-vicryl.  The 5 mm trocars were removed under direct visualization and air was allowed to escape. The 25mm skin incision at the umbilical region was closed with 4-0 monocryl and the 89mm incisions were closed with dermabond.   A vaginal packing was placed at the end of the procedure.  The patient tolerated the procedure well. She was awakened without difficulty and then transported to the recovery room in stable condition. Sponge, needle, and instrument counts were correct.  The uterus and fallopian tubes were sent to pathology.  Dr. Landry Mellow was present throughout the case since residents are not present in our practice and due to the complexity of the case.  She performed surgical management on the right side/portion of the uterus and assisted in retraction/visualization throughout the case.    Janyth Pupa, DO [phone] (pager) [phone] (office)

## 2014-05-31 NOTE — Addendum Note (Signed)
Addendum  created 05/31/14 1802 by Asher Muir, CRNA   Modules edited: Notes Section   Notes Section:  File: [id number]

## 2014-05-31 NOTE — Transfer of Care (Signed)
Immediate Anesthesia Transfer of Care Note  Patient: Sabrina Rose  Procedure(s) Performed: Procedure(s): HYSTERECTOMY TOTAL LAPAROSCOPIC (N/A) LAPAROSCOPIC BILATERAL SALPINGECTOMY (Bilateral)  Patient Location: PACU  Anesthesia Type:General  Level of Consciousness: awake, alert , oriented and patient cooperative  Airway & Oxygen Therapy: Patient Spontanous Breathing and Patient connected to nasal cannula oxygen  Post-op Assessment: Report given to RN and Post -op Vital signs reviewed and stable  Post vital signs: Reviewed and stable  Last Vitals:  Filed Vitals:   05/31/14 0559  BP: 979/15    Complications: No apparent anesthesia complications

## 2014-06-01 DIAGNOSIS — D259 Leiomyoma of uterus, unspecified: Secondary | ICD-10-CM | POA: Diagnosis not present

## 2014-06-01 LAB — CBC
HEMATOCRIT: 22.6 % — AB (ref 36.0–46.0)
Hemoglobin: 7.6 g/dL — ABNORMAL LOW (ref 12.0–15.0)
MCH: 28.5 pg (ref 26.0–34.0)
MCHC: 33.6 g/dL (ref 30.0–36.0)
MCV: 84.6 fL (ref 78.0–100.0)
PLATELETS: 247 10*3/uL (ref 150–400)
RBC: 2.67 MIL/uL — ABNORMAL LOW (ref 3.87–5.11)
RDW: 14.1 % (ref 11.5–15.5)
WBC: 10.9 10*3/uL — ABNORMAL HIGH (ref 4.0–10.5)

## 2014-06-01 LAB — BASIC METABOLIC PANEL
ANION GAP: 6 (ref 5–15)
BUN: 9 mg/dL (ref 6–23)
CO2: 24 mmol/L (ref 19–32)
Calcium: 8 mg/dL — ABNORMAL LOW (ref 8.4–10.5)
Chloride: 107 mmol/L (ref 96–112)
Creatinine, Ser: 0.7 mg/dL (ref 0.50–1.10)
GFR calc Af Amer: >90 mL/min (ref >90)
Glucose, Bld: 107 mg/dL — ABNORMAL HIGH (ref 70–99)
POTASSIUM: 3.9 mmol/L (ref 3.5–5.1)
SODIUM: 137 mmol/L (ref 135–145)

## 2014-06-01 LAB — GLUCOSE, CAPILLARY
GLUCOSE-CAPILLARY: 97 mg/dL (ref 70–99)
Glucose-Capillary: 205 mg/dL — ABNORMAL HIGH (ref 70–99)

## 2014-06-01 MED ORDER — PANTOPRAZOLE SODIUM 40 MG PO TBEC: 40.0000 mg | DELAYED_RELEASE_TABLET | Freq: Every day | ORAL | Status: DC

## 2014-06-01 MED ORDER — DOCUSATE SODIUM 100 MG PO CAPS: 100.0000 mg | ORAL_CAPSULE | Freq: Two times a day (BID) | ORAL | Status: AC

## 2014-06-01 MED ORDER — FERROUS SULFATE 325 (65 FE) MG PO TABS
325.0000 mg | ORAL_TABLET | Freq: Two times a day (BID) | ORAL | Status: DC
  Administered 2014-06-01: 325 mg via ORAL
  Filled 2014-06-01: qty 1

## 2014-06-01 MED ORDER — OXYCODONE-ACETAMINOPHEN 5-325 MG PO TABS: 1.0000 | ORAL_TABLET | Freq: Four times a day (QID) | ORAL | Status: AC | PRN

## 2014-06-01 MED ORDER — FERROUS SULFATE 325 (65 FE) MG PO TABS: 325.0000 mg | ORAL_TABLET | Freq: Two times a day (BID) | ORAL | Status: AC

## 2014-06-01 NOTE — Progress Notes (Signed)
Pt ambulated out teaching complete  

## 2014-06-01 NOTE — Progress Notes (Signed)
The patient is receiving protonix by the intravenous route.  Based on criteria approved by the Pharmacy and [facility], the medication is being converted to the equivalent oral dose form.  These criteria include: -No Active GI bleeding -Able to tolerate diet of full liquids (or better) or tube feeding -Able to tolerate other medications by the oral or enteral route  If you have any questions about this conversion, please contact the Pharmacy Department (ext [phone]).  Thank you.  Beryle Lathe 06/01/2014

## 2014-06-01 NOTE — Progress Notes (Addendum)
Postoperative Day #1  S:  Patient resting comfortable in bed.  Pain controlled.  Tolerating general diet. No flatus, no BM.  Minimal vaginal bleeding, packing and foley removed this am.  Ambulating without difficulty.  She denies n/v/f/c, SOB, or CP.  No acute complaints and overall feeling well.  O: Temp:  [97.4 F (36.3 C)-99.7 F (37.6 C)] 99.7 F (37.6 C) (03/24 0523) Pulse Rate:  [53-75] 72 (03/24 0523) Resp:  [14-24] 18 (03/24 0523) BP: (98-126)/(57-78) 98/57 mmHg (03/24 0523) SpO2:  [98 %-100 %] 98 % (03/24 0523) Weight:  [114.306 kg (252 lb)] 114.306 kg (252 lb) (03/23 1415) Gen: A&Ox3, NAD CV: RRR, no MRG Resp: CTAB Abdomen: soft, NT, ND +BS, umbilical incision with bandage- C/D/I.  Bilateral lower quadrants with dermabond- C/D/I Ext: No edema, no calf tenderness bilaterally  Labs:  CBC Latest Ref Rng 06/01/2014 05/31/2014 05/29/2014  WBC 4.0 - 10.5 K/uL 10.9(H) 11.1(H) 7.5  Hemoglobin 12.0 - 15.0 g/dL 7.6(L) 9.7(L) 11.5(L)  Hematocrit 36.0 - 46.0 % 22.6(L) 28.9(L) 34.2(L)  Platelets 150 - 400 K/uL 247 288 340    A/P: Pt is a 49 y.o. G2P0020 s/p TLH, BS, POD#1  - Pain well controlled -GU: UOP is adequate by foley, pt to void this am -GI: Tolerating general diet -Activity: encouraged sitting up to chair and ambulation as tolerated -Prophylaxis: early ambulation, SCDs while in bed -Heme: Appropriate decline based on EBL, pt asymptomatic, will start on iron twice daily.  Reviewed findings of anemia with patient, will follow as outpatient. -Hypothyroidism- pt to continue with home Levothyroxine -HTN- BP medication on hold and pt to discontinue for the next week -DM- diet controlled, continue with diabetic diet  Meeting postoperative milestones appropriately, plan for discharge home later today.  Sabrina Pupa, DO [phone] (pager) [phone] (office)

## 2014-06-01 NOTE — Discharge Instructions (Signed)
Laparoscopically Hysterectomy, Care After Refer to this sheet in the next few weeks. These instructions provide you with information on caring for yourself after your procedure. Your health care provider may also give you more specific instructions. Your treatment has been planned according to current medical practices, but problems sometimes occur. Call your health care provider if you have any problems or questions after your procedure. WHAT TO EXPECT AFTER THE PROCEDURE After your procedure, it is typical to have the following:  Abdominal pain. You will be given pain medicine to control it.  Sore throat from the breathing tube that was inserted during surgery. HOME CARE INSTRUCTIONS  For pain management: Alternate between Percocet (for moderate/severe pain) and Motrin.  Percocet may cause constipation- so be sure to continue with the Colace twice daily as needed.  You may also use any over the counter medication for constipation as needed.  Do not take aspirin. It can cause bleeding.  Do not drive when taking pain medicine (specifically the Percocet)  Follow your health care provider's advice regarding diet, exercise, lifting, driving, and general activities.  Resume your usual diet as directed and allowed.  Get plenty of rest and sleep.  Do not douche, use tampons, or have sexual intercourse for at least 6 weeks, or until your health care provider gives you permission.  Change your bandages (dressings) as directed by your health care provider.  Monitor your temperature and notify your health care provider of a fever.  Take showers instead of baths for 4 weeks.  Do not drink alcohol until your health care provider gives you permission.  If you develop constipation, you may take a mild laxative with your health care provider's permission. Bran foods may help with constipation problems. Drinking enough fluids to keep your urine clear or pale yellow may help as well.  Try to have  someone home with you for 1-2 weeks to help around the house.  Keep all of your follow-up appointments as directed by your health care provider.  You may return to your normal diabetic diet.    If possible check your blood pressure at home, if higher than 140/90, ok to take blood pressure medication.  Otherwise, you may hold off on your blood pressure medication for the next week.  If you are not sure, please call your physician. SEEK MEDICAL CARE IF:   You have swelling, redness, or increasing pain around your incision sites.  You have pus coming from your incision.  You notice a bad smell coming from your incision.  Your incision breaks open.  You feel dizzy or lightheaded.  You have pain or bleeding when you urinate.  You have persistent diarrhea.  You have persistent nausea and vomiting.  You have abnormal vaginal discharge.  You have a rash.  You have any type of abnormal reaction or develop an allergy to your medicine.  You have poor pain control with your prescribed medicine. SEEK IMMEDIATE MEDICAL CARE IF:   You have a fever.  You have severe abdominal pain.  You have chest pain.  You have shortness of breath.  You faint.  You have pain, swelling, or redness in your leg.  You have heavy vaginal bleeding with blood clots. MAKE SURE YOU:  Understand these instructions.  Will watch your condition.  Will get help right away if you are not doing well or get worse. Document Released: 02/13/2011 Document Revised: 03/01/2013 Document Reviewed: 09/09/2012 [facility] Patient Information 2015 [place], [place]. This information is not intended to  replace advice given to you by your health care provider. Make sure you discuss any questions you have with your health care provider. ° °

## 2014-06-02 ENCOUNTER — Encounter (HOSPITAL_COMMUNITY): Payer: Self-pay | Admitting: Obstetrics & Gynecology

## 2014-06-08 NOTE — Discharge Summary (Signed)
Physician Discharge Summary  Patient ID: Sabrina Rose MRN: [id number] DOB/AGE: 1965/05/24 49 y.o.  Admit date: 05/31/2014 Discharge date: 06/01/2014  Admission Diagnoses: 1) Abnormal uterine bleeding 2) Uterine fibroids 3) h/o endometrial hyperplasia 4) Obesity 5) Hypothyroidism 6) Type II DM  Discharge Diagnoses: same  Discharged Condition: stable  Hospital Course: Sabrina Rose is a 49yo G87P0020 female who presented for total laparoscopic hysterectomy and bilateral salpingectomy due to abnormal uterine bleeding and history of endometrial hyperplasia. In review, the patient was diagnosed with endometrial hyperplasia that was previously managed with a Mirena. However, over the past few months, she has started to have intermentrual bleeding along with heavier menses. As part of her management, an [place] was performed that revealed a slightly enlarged uterus measuring 11x7.3x7.2cm with 3 small fibroids (anterior fundal 2.5x2.4x2.3 cm, anterior 2.6x2.4x2.1cm, and fundal 2.6x2.6x2.1cm) normal endometrium, normal ovaries bilaterally. Due to concern for infection (Actinomyces noted on pap smear) and inadequate EMB, the Mirena was removed and a repeat EMB showed no hyperplasia or malignancy. Pt has been on progesterone to control her heavy bleeding with only minimal improvement. After much discussions regarding her options, she wishes to proceed with surgical management.  The patient underwent a laparoscopic hysterectomy and bilateral salpingectomy on 3/23, for information regarding the surgery, please see the operative report.  There were no complications during the surgery and her postoperative course was uncomplicated.  Though she did have a decline in her Hgb this was expected due to blood loss during surgery.  The patient was asymptomatic and sent home on iron daily.  She met her postoperative milestones appropriately and was discharged home on POD #1 in stable condition.  Consults: None  Significant  Diagnostic Studies: labs:  CBC Latest Ref Rng 06/01/2014 05/31/2014 05/29/2014  WBC 4.0 - 10.5 K/uL 10.9(H) 11.1(H) 7.5  Hemoglobin 12.0 - 15.0 g/dL 7.6(L) 9.7(L) 11.5(L)  Hematocrit 36.0 - 46.0 % 22.6(L) 28.9(L) 34.2(L)  Platelets 150 - 400 K/uL 247 288 340    Treatments: IV hydration, antibiotics: Ancef, analgesia: Toradol, Percocet and surgery: Total laparoscopic hysterectomy, bilateral salpingectomy  Discharge Exam: Blood pressure 98/57, pulse 72, temperature 99.7 F (37.6 C), temperature source Oral, resp. rate 18, height 5' 11"  (1.803 m), weight 114.306 kg (252 lb), SpO2 98 %. Gen: A&Ox3, NAD CV: RRR, no MRG Resp: CTAB Abdomen: soft, NT, ND +BS, umbilical incision with bandage- C/D/I. Bilateral lower quadrants with dermabond- C/D/I Ext: No edema, no calf tenderness bilaterally  Disposition: 01-Home or Self Care     Medication List    TAKE these medications        bisacodyl 5 MG EC tablet  Commonly known as:  DULCOLAX  Take 20 mg by mouth daily as needed for mild constipation or moderate constipation.     docusate sodium 100 MG capsule  Commonly known as:  COLACE  Take 1 capsule (100 mg total) by mouth 2 (two) times daily.     ferrous sulfate 325 (65 FE) MG tablet  Take 1 tablet (325 mg total) by mouth 2 (two) times daily with a meal.     levothyroxine 112 MCG tablet  Commonly known as:  SYNTHROID, LEVOTHROID  Take 112 mcg by mouth daily before breakfast.     losartan 50 MG tablet  Commonly known as:  COZAAR  Take 50 mg by mouth daily.        oxyCODONE-acetaminophen 5-325 MG per tablet  Commonly known as:  PERCOCET/ROXICET  Take 1-2 tablets by mouth every 6 (six) hours as needed  for moderate pain (moderate to severe pain (when tolerating fluids)).     Polyethyl Glycol-Propyl Glycol 0.4-0.3 % Soln  Apply 1-2 drops to eye 3 (three) times daily as needed (For dryness.).           Follow-up Information    Follow up with Janyth Pupa, M, DO In 2 weeks.    Specialty:  Obstetrics and Gynecology   Contact information:   [address] [place] [address] [phone]       Signed: Annalee Genta 06/08/2014, 7:11 AM

## 2016-05-01 ENCOUNTER — Other Ambulatory Visit: Payer: Self-pay | Admitting: Obstetrics & Gynecology

## 2016-05-01 DIAGNOSIS — Z1231 Encounter for screening mammogram for malignant neoplasm of breast: Secondary | ICD-10-CM

## 2016-05-20 ENCOUNTER — Ambulatory Visit: Payer: BLUE CROSS/BLUE SHIELD

## 2016-06-26 ENCOUNTER — Ambulatory Visit
Admission: RE | Admit: 2016-06-26 | Discharge: 2016-06-26 | Disposition: A | Discharge status: Home / Self Care | Payer: Managed Care, Other (non HMO) | Source: Ambulatory Visit | Attending: Obstetrics & Gynecology | Admitting: Obstetrics & Gynecology

## 2016-06-26 DIAGNOSIS — Z1231 Encounter for screening mammogram for malignant neoplasm of breast: Secondary | ICD-10-CM

## 2017-09-04 ENCOUNTER — Other Ambulatory Visit: Payer: Self-pay | Admitting: Obstetrics & Gynecology

## 2017-09-04 DIAGNOSIS — Z1231 Encounter for screening mammogram for malignant neoplasm of breast: Secondary | ICD-10-CM

## 2017-09-28 ENCOUNTER — Ambulatory Visit: Payer: Managed Care, Other (non HMO)

## 2018-01-26 ENCOUNTER — Ambulatory Visit
Admission: RE | Admit: 2018-01-26 | Discharge: 2018-01-26 | Disposition: A | Discharge status: Home / Self Care | Payer: Managed Care, Other (non HMO) | Source: Ambulatory Visit | Attending: Obstetrics & Gynecology | Admitting: Obstetrics & Gynecology

## 2018-01-26 DIAGNOSIS — Z1231 Encounter for screening mammogram for malignant neoplasm of breast: Secondary | ICD-10-CM

## 2018-03-19 DIAGNOSIS — Z1211 Encounter for screening for malignant neoplasm of colon: Secondary | ICD-10-CM | POA: Diagnosis not present

## 2018-03-19 DIAGNOSIS — K5730 Diverticulosis of large intestine without perforation or abscess without bleeding: Secondary | ICD-10-CM | POA: Diagnosis not present

## 2018-03-19 DIAGNOSIS — Z800 Family history of malignant neoplasm of digestive organs: Secondary | ICD-10-CM | POA: Diagnosis not present

## 2018-03-19 DIAGNOSIS — D123 Benign neoplasm of transverse colon: Secondary | ICD-10-CM | POA: Diagnosis not present

## 2018-03-23 DIAGNOSIS — Z1211 Encounter for screening for malignant neoplasm of colon: Secondary | ICD-10-CM | POA: Diagnosis not present

## 2018-03-23 DIAGNOSIS — D123 Benign neoplasm of transverse colon: Secondary | ICD-10-CM | POA: Diagnosis not present

## 2018-05-12 DIAGNOSIS — L02821 Furuncle of head [any part, except face]: Secondary | ICD-10-CM | POA: Diagnosis not present

## 2018-05-12 DIAGNOSIS — L280 Lichen simplex chronicus: Secondary | ICD-10-CM | POA: Diagnosis not present

## 2018-05-12 DIAGNOSIS — L218 Other seborrheic dermatitis: Secondary | ICD-10-CM | POA: Diagnosis not present

## 2018-05-12 DIAGNOSIS — B9689 Other specified bacterial agents as the cause of diseases classified elsewhere: Secondary | ICD-10-CM | POA: Diagnosis not present

## 2018-05-14 DIAGNOSIS — E039 Hypothyroidism, unspecified: Secondary | ICD-10-CM | POA: Diagnosis not present

## 2018-05-14 DIAGNOSIS — F17210 Nicotine dependence, cigarettes, uncomplicated: Secondary | ICD-10-CM | POA: Diagnosis not present

## 2018-05-14 DIAGNOSIS — E1140 Type 2 diabetes mellitus with diabetic neuropathy, unspecified: Secondary | ICD-10-CM | POA: Diagnosis not present

## 2018-05-14 DIAGNOSIS — E785 Hyperlipidemia, unspecified: Secondary | ICD-10-CM | POA: Diagnosis not present

## 2019-04-12 IMAGING — MG DIGITAL SCREENING BILATERAL MAMMOGRAM WITH TOMO AND CAD
8 series · 8 of 24 positions shown · non-contrast
Comparison: Previous exam(s).

CLINICAL DATA: Screening.

EXAM:
DIGITAL SCREENING BILATERAL MAMMOGRAM WITH TOMO AND CAD

[R CC synth-2D]
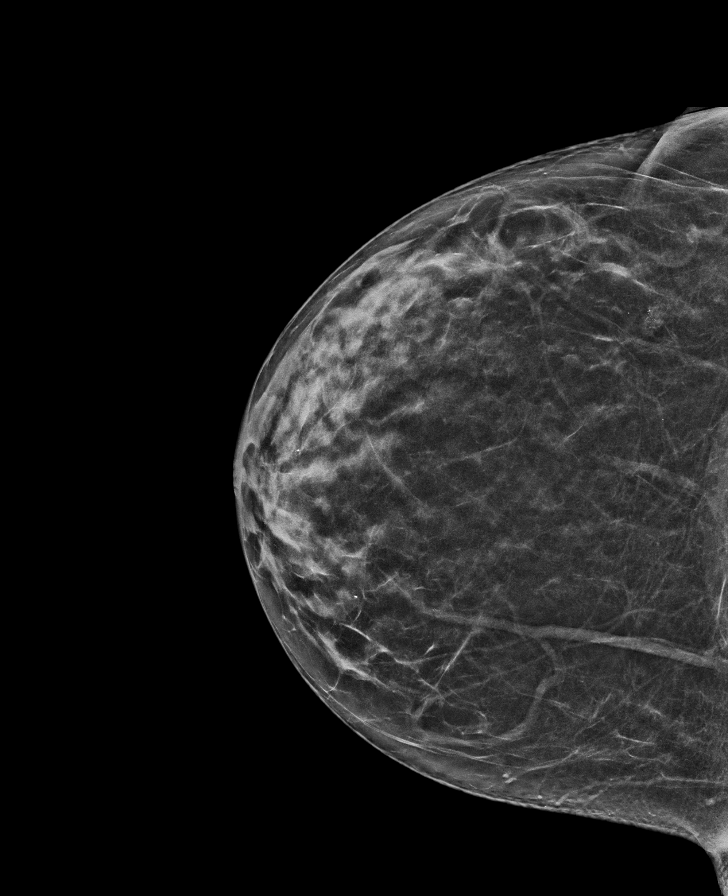

[R MLO synth-2D]
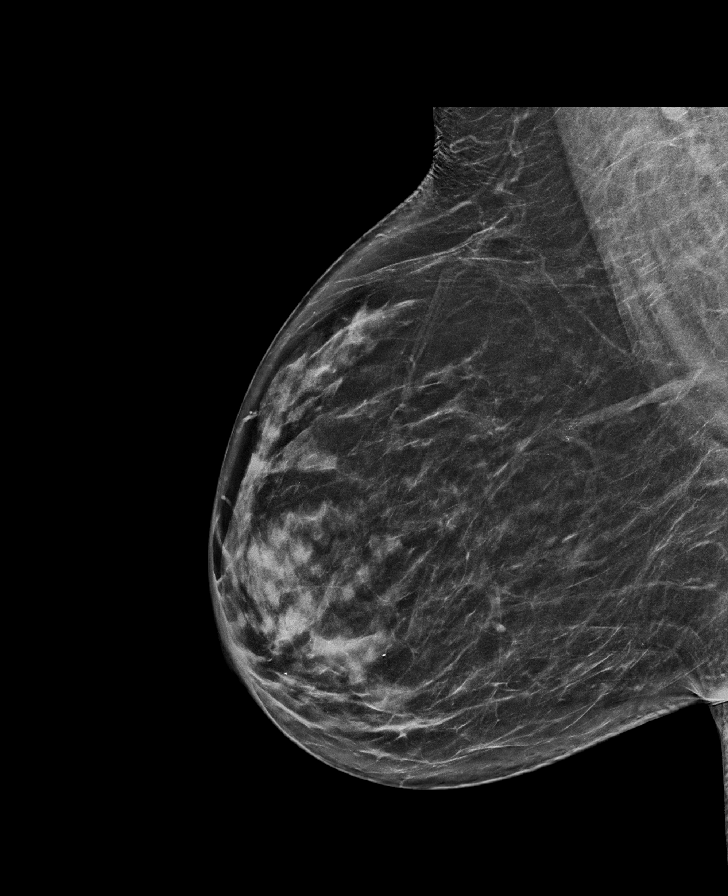

[L CC synth-2D]
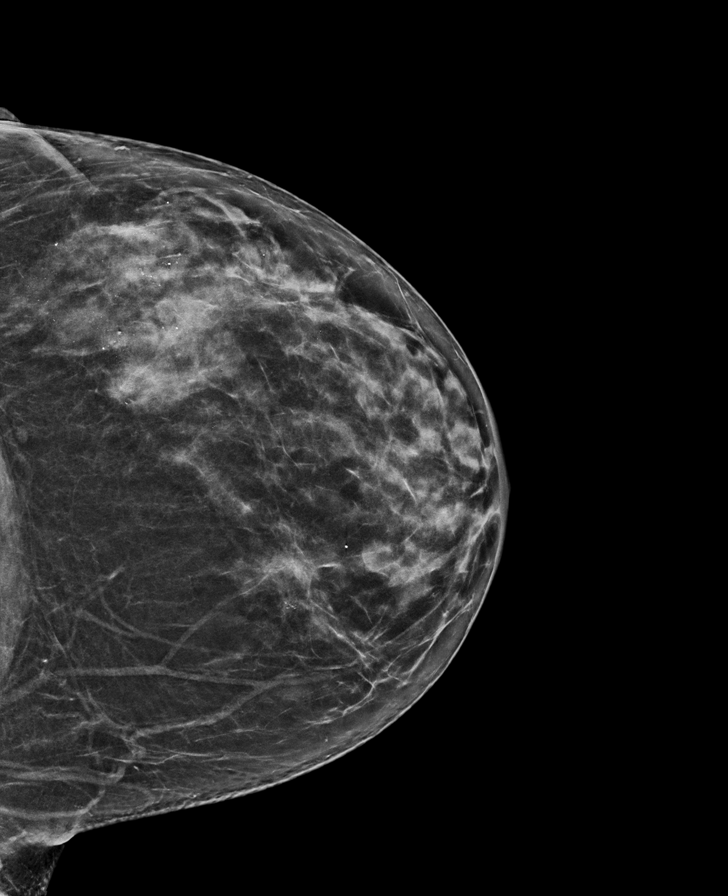

[L MLO synth-2D]
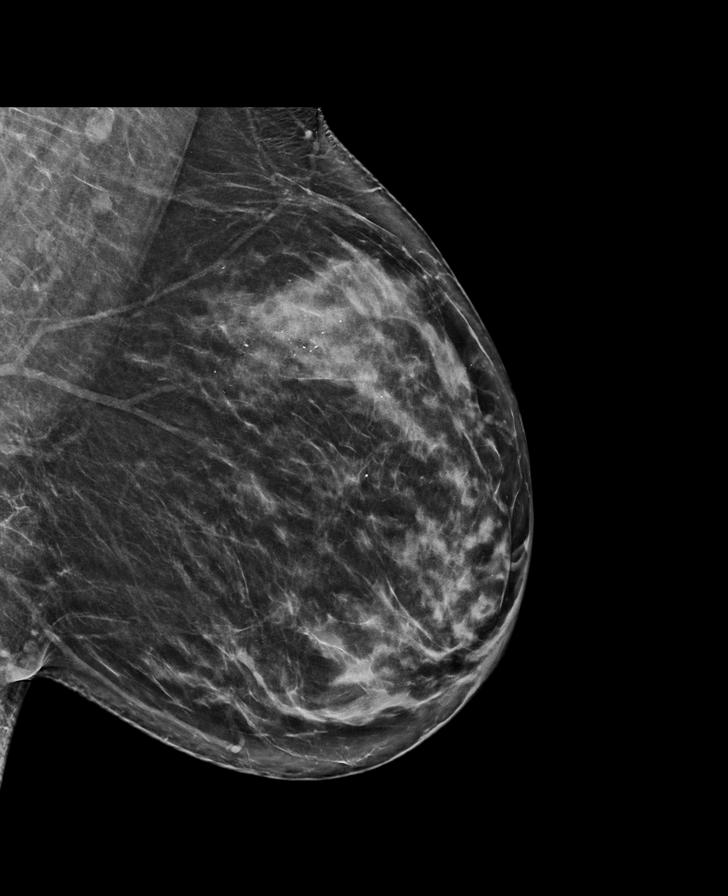

[L MLO tomo · tomo slice 41/82.0]
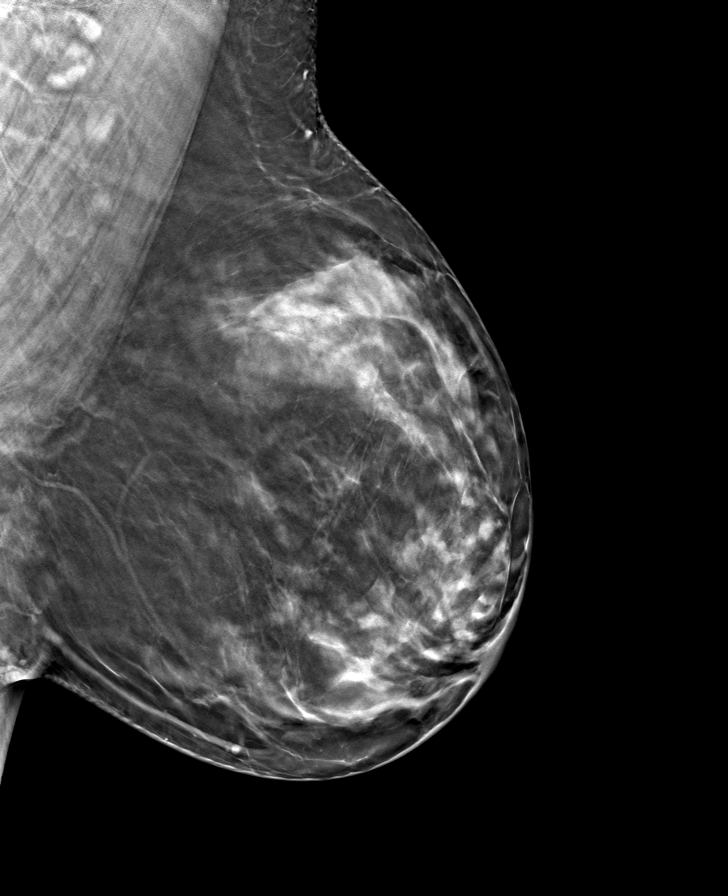

[R CC tomo · tomo slice 35/69.0]
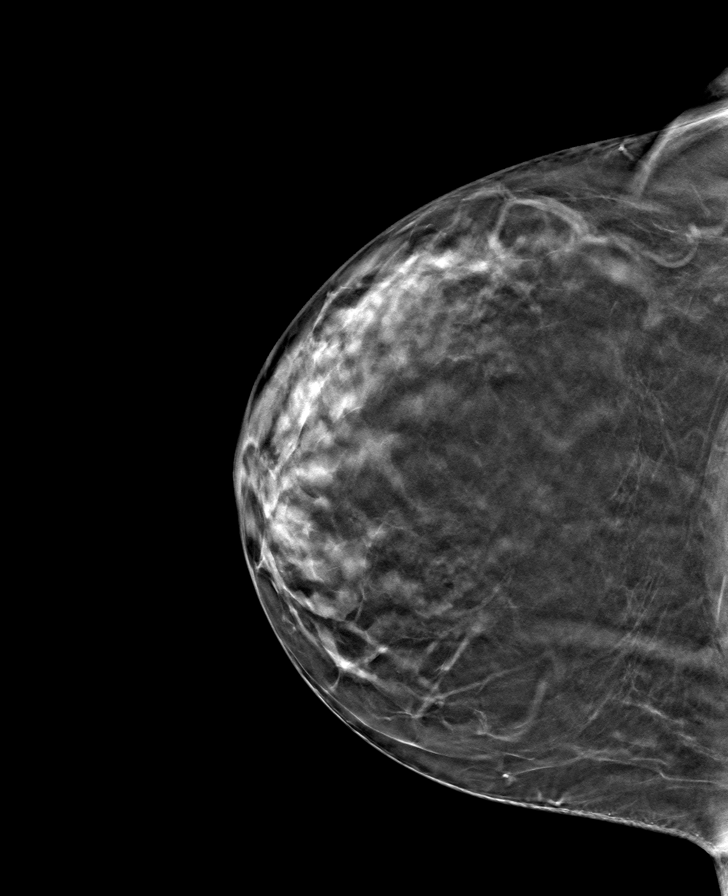

[L CC tomo · tomo slice 37/73.0]
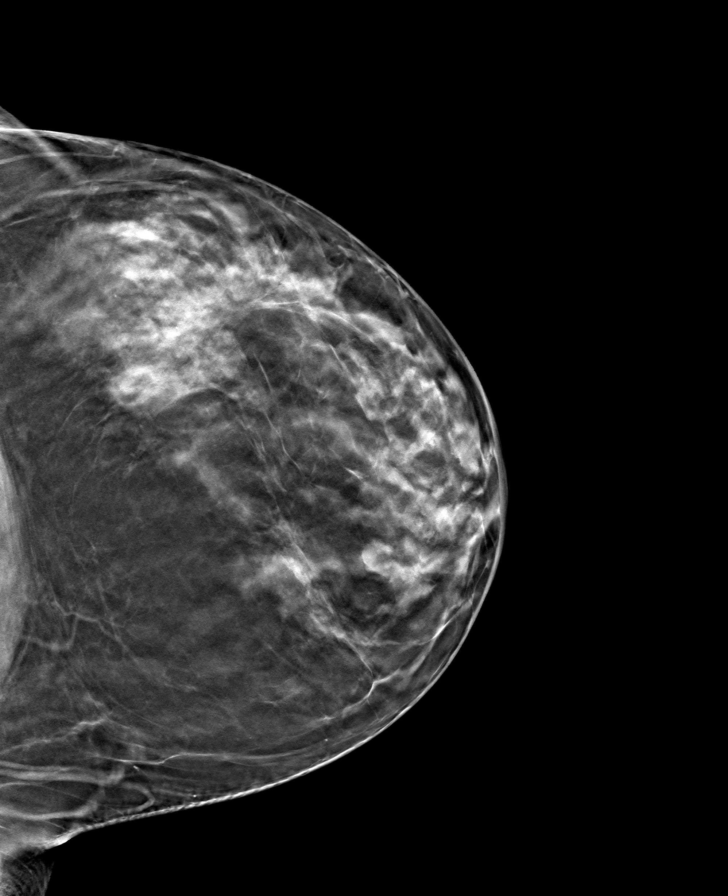

[R MLO tomo · tomo slice 39/77.0]
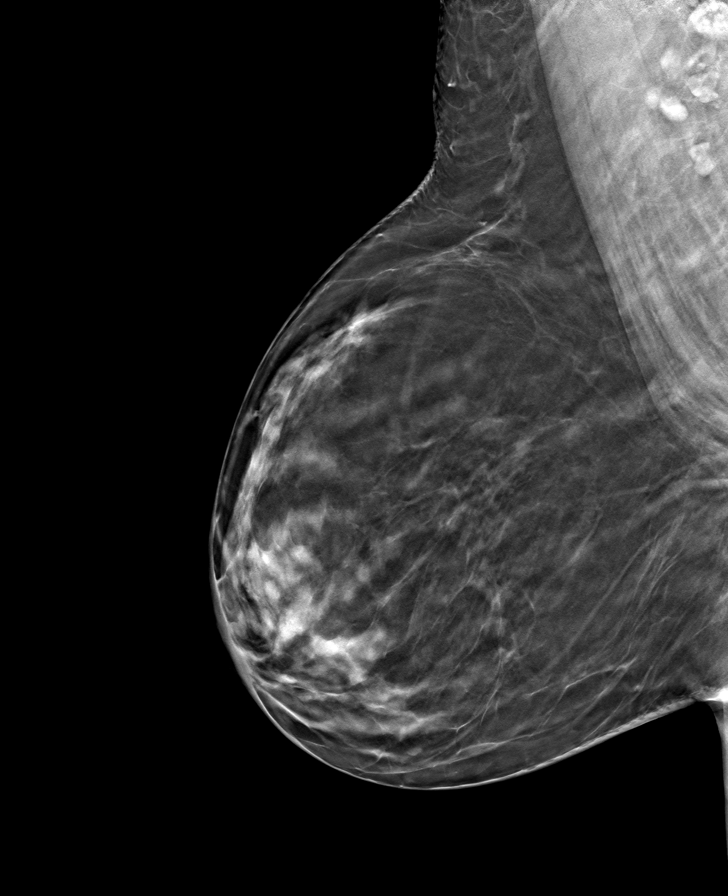

[8 of 24 positions shown; findings below may reference images not displayed]

ACR Breast Density Category c: The breast tissue is heterogeneously
dense, which may obscure small masses.
FINDINGS: There are no findings suspicious for malignancy. Images were
processed with CAD.
IMPRESSION: No mammographic evidence of malignancy. A result letter of this
screening mammogram will be mailed directly to the patient.

RECOMMENDATION:
Screening mammogram in one year. (Code:[id number])

BI-RADS CATEGORY  1: Negative.

## 2021-04-03 ENCOUNTER — Encounter: Attending: Gastroenterology | Primary: Family

## 2021-04-23 ENCOUNTER — Encounter: Attending: Gastroenterology | Primary: Family

## 2021-05-08 ENCOUNTER — Encounter: Attending: Gastroenterology | Primary: Family

## 2021-05-28 ENCOUNTER — Ambulatory Visit
Admit: 2021-05-28 | Discharge: 2021-05-28 | Payer: PRIVATE HEALTH INSURANCE | Attending: Gastroenterology | Primary: Family

## 2021-05-28 DIAGNOSIS — Z86010 Personal history of colonic polyps: Secondary | ICD-10-CM

## 2021-05-28 MED ORDER — SODIUM,POTASSIUM,&MAG SULFATES 17.5G-3.13G-1.6G ORAL SOLN
ORAL | 0 refills | Status: DC
Start: 2021-05-28 — End: 2021-06-04

## 2021-06-04 ENCOUNTER — Inpatient Hospital Stay
Admit: 2021-06-04 | Discharge: 2021-06-04 | Disposition: A | Discharge status: Home / Self Care | Payer: PRIVATE HEALTH INSURANCE | Attending: Emergency Medicine

## 2021-06-04 ENCOUNTER — Emergency Department: Admit: 2021-06-04 | Payer: PRIVATE HEALTH INSURANCE | Primary: Family

## 2021-06-04 DIAGNOSIS — R002 Palpitations: Secondary | ICD-10-CM

## 2021-06-04 DIAGNOSIS — R079 Chest pain, unspecified: Secondary | ICD-10-CM

## 2021-06-04 LAB — COMPREHENSIVE METABOLIC PANEL
ALT: 30 U/L (ref 14–63)
AST: 26 U/L (ref 15–37)
Albumin/Globulin Ratio: 1 (ref 1.0–1.5)
Albumin: 3.7 g/dL (ref 3.4–5.0)
Alkaline Phosphatase: 54 U/L (ref 46–116)
Anion Gap: 8 mmol/L (ref 4–12)
BUN: 13 mg/dL (ref 7–18)
CO2: 28 mmol/L (ref 21.0–32.0)
Calcium: 8.8 mg/dL (ref 8.5–10.1)
Chloride: 106 mmol/L (ref 98–107)
Creatinine: 0.75 mg/dL (ref 0.55–1.02)
GFR African American: >60 mL/min/{1.73_m2} (ref >60)
Globulin: 3.6 g/dL (ref 2.8–3.9)
Glucose: 198 mg/dL — ABNORMAL HIGH (ref 74–106)
Potassium: 4.4 mmol/L (ref 3.5–5.1)
Sodium: 142 mmol/L (ref 136–145)
Total Bilirubin: 0.6 mg/dL (ref 0.2–1.0)
Total Protein: 7.3 g/dL (ref 6.0–8.0)
eGFR NON-AA: >60 mL/min/{1.73_m2} (ref >60)

## 2021-06-04 LAB — CBC WITH AUTO DIFFERENTIAL
Basophils %: 1 % (ref 0.0–3.0)
Basophils Absolute: 0.1 10*3/uL (ref 0.0–0.4)
Eosinophils %: 4 % (ref 0.0–7.0)
Eosinophils Absolute: 0.2 10*3/uL (ref 0.0–1.0)
Granulocyte Absolute Count: 0.1 10*3/uL (ref 0.0–0.17)
Hematocrit: 36.8 % (ref 36.0–47.0)
Hemoglobin: 12.3 g/dL (ref 12.0–16.0)
Immature Granulocytes %: 1 % — ABNORMAL HIGH (ref 0.0–0.5)
Lymphocytes %: 27 % (ref 18.0–40.0)
Lymphocytes Absolute: 1.8 10*3/uL (ref 0.9–4.2)
MCH: 28.6 PG (ref 27.0–35.0)
MCHC: 33.4 g/dL (ref 30.7–37.3)
MCV: 85.6 FL (ref 81.0–94.0)
MPV: 10 FL (ref 9.2–11.8)
Monocytes %: 6 % (ref 2.0–12.0)
Monocytes Absolute: 0.4 10*3/uL (ref 0.1–1.7)
NRBC Absolute: 0 10*3/uL (ref 0.0–0.01)
Neutrophils %: 61 % (ref 48.0–72.0)
Neutrophils Absolute: 4.1 10*3/uL (ref 2.3–7.6)
Nucleated RBCs: 0 PER 100 WBC
Platelets: 249 10*3/uL (ref 130–400)
RBC: 4.3 M/uL (ref 4.20–5.40)
RDW: 13.6 % (ref 11.5–14.0)
WBC: 6.6 10*3/uL (ref 4.8–10.6)

## 2021-06-04 LAB — EKG 12-LEAD
Atrial Rate: 55 {beats}/min
P Axis: 19 degrees
P-R Interval: 144 ms
Q-T Interval: 478 ms
QRS Duration: 74 ms
QTc Calculation (Bazett): 457 ms
R Axis: -10 degrees
T Axis: 18 degrees
Ventricular Rate: 55 {beats}/min

## 2021-06-04 LAB — TROPONIN, HIGH SENSITIVITY
Troponin, High Sensitivity: 13 ng/L (ref 0.0–50.0)
Troponin, High Sensitivity: 11 ng/L (ref 0.0–50.0)

## 2021-06-04 LAB — D-DIMER, QUANTITATIVE: D-Dimer, Quant: 722 ng/mL — ABNORMAL HIGH (ref 0–400)

## 2021-06-04 LAB — BRAIN NATRIURETIC PEPTIDE: BNP: 202 pg/mL — ABNORMAL HIGH (ref 0–100)

## 2021-06-04 LAB — MAGNESIUM
Magnesium: 1.7 mg/dL — ABNORMAL LOW (ref 1.8–2.4)
Magnesium: 1.7 mg/dL — ABNORMAL LOW (ref 1.8–2.4)

## 2021-06-04 LAB — EKG, 12 LEAD, INITIAL
Atrial Rate: 55 {beats}/min
Calculated P Axis: 19 degrees
Calculated R Axis: -10 degrees
Calculated T Axis: 18 degrees
P-R Interval: 144 ms
Q-T Interval: 478 ms
QRS Duration: 74 ms
QTC Calculation (Bezet): 457 ms
Ventricular Rate: 55 {beats}/min

## 2021-06-04 LAB — METABOLIC PANEL, COMPREHENSIVE
A-G Ratio: 1 (ref 1.0–1.5)
ALT (SGPT): 30 U/L (ref 14–63)
AST (SGOT): 26 U/L (ref 15–37)
Albumin: 3.7 g/dL (ref 3.4–5.0)
Alk. phosphatase: 54 U/L (ref 46–116)
Anion gap: 8 mmol/L (ref 4–12)
BUN: 13 mg/dL (ref 7–18)
Bilirubin, total: 0.6 mg/dL (ref 0.2–1.0)
CO2: 28 mmol/L (ref 21.0–32.0)
Calcium: 8.8 mg/dL (ref 8.5–10.1)
Chloride: 106 mmol/L (ref 98–107)
Creatinine: 0.75 mg/dL (ref 0.55–1.02)
GFR est AA: >60 mL/min/{1.73_m2} (ref >60)
GFR est non-AA: >60 mL/min/{1.73_m2} (ref >60)
Globulin: 3.6 g/dL (ref 2.8–3.9)
Glucose: 198 mg/dL — ABNORMAL HIGH (ref 74–106)
Potassium: 4.4 mmol/L (ref 3.5–5.1)
Protein, total: 7.3 g/dL (ref 6.0–8.0)
Sodium: 142 mmol/L (ref 136–145)

## 2021-06-04 LAB — CBC WITH AUTOMATED DIFF
ABS. BASOPHILS: 0.1 10*3/uL (ref 0.0–0.4)
ABS. EOSINOPHILS: 0.2 10*3/uL (ref 0.0–1.0)
ABS. IMM. GRANS.: 0.1 10*3/uL (ref 0.0–0.17)
ABS. LYMPHOCYTES: 1.8 10*3/uL (ref 0.9–4.2)
ABS. MONOCYTES: 0.4 10*3/uL (ref 0.1–1.7)
ABS. NEUTROPHILS: 4.1 10*3/uL (ref 2.3–7.6)
ABSOLUTE NRBC: 0 10*3/uL (ref 0.0–0.01)
BASOPHILS: 1 % (ref 0.0–3.0)
EOSINOPHILS: 4 % (ref 0.0–7.0)
HCT: 36.8 % (ref 36.0–47.0)
HGB: 12.3 g/dL (ref 12.0–16.0)
IMMATURE GRANULOCYTES: 1 % — ABNORMAL HIGH (ref 0.0–0.5)
LYMPHOCYTES: 27 % (ref 18.0–40.0)
MCH: 28.6 pg (ref 27.0–35.0)
MCHC: 33.4 g/dL (ref 30.7–37.3)
MCV: 85.6 fL (ref 81.0–94.0)
MONOCYTES: 6 % (ref 2.0–12.0)
MPV: 10 fL (ref 9.2–11.8)
NEUTROPHILS: 61 % (ref 48.0–72.0)
NRBC: 0 /100{WBCs}
PLATELET: 249 10*3/uL (ref 130–400)
RBC: 4.3 M/uL (ref 4.20–5.40)
RDW: 13.6 % (ref 11.5–14.0)
WBC: 6.6 10*3/uL (ref 4.8–10.6)

## 2021-06-04 LAB — BNP: BNP: 202 pg/mL — ABNORMAL HIGH (ref 0–100)

## 2021-06-04 LAB — TROPONIN-HIGH SENSITIVITY
Troponin-High Sensitivity: 13 ng/L (ref 0.0–50.0)
Troponin-High Sensitivity: 11 ng/L (ref 0.0–50.0)

## 2021-06-04 LAB — D DIMER: D-dimer: 722 ng/mL (D-DU) — ABNORMAL HIGH (ref 0–400)

## 2021-06-04 MED ORDER — ASPIRIN 81 MG CHEWABLE TAB
81 mg | ORAL | Status: AC
Start: 2021-06-04 — End: 2021-06-04
  Administered 2021-06-04: 16:00:00 via ORAL

## 2021-06-04 MED ORDER — SODIUM CHLORIDE 0.9% BOLUS IV
0.9 % | Freq: Once | INTRAVENOUS | Status: AC
Start: 2021-06-04 — End: 2021-06-04
  Administered 2021-06-04: 17:00:00 via INTRAVENOUS

## 2021-06-04 MED ORDER — IOPAMIDOL 76 % IV SOLN
370 mg iodine /mL (76 %) | Freq: Once | INTRAVENOUS | Status: AC
Start: 2021-06-04 — End: 2021-06-04
  Administered 2021-06-04: 17:00:00 via INTRAVENOUS

## 2021-06-04 MED FILL — SODIUM CHLORIDE 0.9 % IV: INTRAVENOUS | Qty: 1000

## 2021-06-04 MED FILL — ISOVUE-370  76 % INTRAVENOUS SOLUTION: 370 mg iodine /mL (76 %) | INTRAVENOUS | Qty: 100

## 2021-06-04 MED FILL — ASPIRIN 81 MG CHEWABLE TAB: 81 mg | ORAL | Qty: 2

## 2021-06-04 NOTE — ED Notes (Signed)
I have reviewed discharge instructions with the patient.  The patient verbalized understanding. Patient ambulated out of the ED with a steady gait.

## 2021-06-04 NOTE — ED Notes (Signed)
 ED MD on phone with cardiologist.

## 2021-06-04 NOTE — ED Notes (Signed)
 Pt to be discharged with cardio follow up tomorrow.

## 2021-06-04 NOTE — ED Notes (Signed)
 Pt ambulatory to ER with C/o increased SOB and Chest pressure X 4 days.  Denies diaphoresis, no vomiting.  (+) dizziness

## 2021-06-04 NOTE — ED Notes (Signed)
 EDMD at bedside

## 2021-06-04 NOTE — ED Notes (Signed)
 Pt states a feeling of chest pressure the past few days but denies pain at this time. Pt states she is in no distress at this time. Pt was sent by [facility] by primary care to get cardiac workup due to her symptoms over the past 4 days.

## 2021-06-04 NOTE — ED Notes (Signed)
Lab contacted for results

## 2021-06-06 ENCOUNTER — Ambulatory Visit
Admit: 2021-06-06 | Discharge: 2021-06-06 | Payer: PRIVATE HEALTH INSURANCE | Attending: Cardiovascular Disease | Primary: Family

## 2021-06-06 ENCOUNTER — Encounter

## 2021-06-06 DIAGNOSIS — E034 Atrophy of thyroid (acquired): Secondary | ICD-10-CM

## 2021-06-06 NOTE — Progress Notes (Signed)
06/06/2021 06/06/21      No referring provider defined for this encounter.     Patient:  Michelle Matthews   DOB:  1965-05-22       CHIEF COMPLAINT:   Chief Complaint   Patient presents with    New Patient     Cardiac evaluation        Dear Dr. Sherlon Handing,    HISTORY OF PRESENT ILLNESS: Today I had the pleasure of seeing your patient, Michelle Matthews in cardiology consultation. As you know, she is a 56 y.o. year old female who  has a past medical history of Chest pain at rest (06/06/2021), Class 2 obesity in adult (06/06/2021), and Hypothyroid. presenting for evaluation of episodes of palpitation and chest pain in addition to shortness of breath which prompted her to go to the emergency room troponins were normal D-dimer was elevated    PAST MEDICAL HISTORY:  Past Medical History:   Diagnosis Date    Chest pain at rest 06/06/2021    Class 2 obesity in adult 06/06/2021    Hypothyroid         PAST SURGICAL HISTORY:  Past Surgical History:   Procedure Laterality Date    HX CHOLECYSTECTOMY      HX PARTIAL HYSTERECTOMY         MEDICATIONS:  Current Outpatient Medications on File Prior to Visit   Medication Sig Dispense Refill    Synthroid 125 mcg tablet       Linzess 290 mcg cap capsule TAKE 1 CAPSULE BY MOUTH EVERY MORNING AS DIRECTED 30 MIN BEFORE BREAKFAST X 90 DAYS      traZODone (DESYREL) 50 mg tablet Take 50 mg by mouth as needed.         No current facility-administered medications on file prior to visit.        ALLERGIES:   No Known Allergies    SOCIAL HISTORY:  reports that she quit smoking about 10 years ago. Her smoking use included cigarettes. She does not have any smokeless tobacco history on file. She reports that she does not currently use alcohol. She reports that she does not currently use drugs.      FAMILY HISTORY: family history includes Breast Cancer in her mother; Colon Cancer in her mother; Diabetes in her mother; Hypertension in her mother; Thyroid Disease in her father.    REVIEW OF SYSTEMS:  Neuro: No headache,  focal weakness/numbness, parasthesias  Eyes: No visual disturbance  ENMT: No soar throat, nasal congestion, hearing loss, tinnitus  CV: No chest pain, palpitations, dizziness, syncope, claudication  Resp: No dyspnea, cough  GI: No abdominal pain, N/V, diarrhea, constipation  GU: No hematuria, dysuria  Skin: No rash  Heme: No bleeding, bruising  Constitutional: No fatigue, weakness, fever, weight change  Musculoskeletal: No myalgias, arthralgias  Other:    PHYSICAL EXAM:  Blood pressure (!) 140/82, pulse 67, height 5\' 11"  (1.803 m), weight 268 lb (121.6 kg), SpO2 98 %.   General: Well developed, well nourished, no acute distress, appears stated age  Eyes: Anicteric, no hemorrhage, normal appearing conjunctivae and lids  ENMT: Normal externally  Neck: Supple, no JVD, no carotid bruit  CV: RRR, normal S1S2, no M/G/R  Lungs: CTA bilaterally, normal respiratory excursion  Abd: Soft, NT, ND, no palpable mass  Extremities: No pedal edema  Skin: Warm and dry  Musculoskeletal: Normal muscle tone, normal gait  Neuro: Nonfocal exam, no rigidity/tremor  Psych: Alert and oriented, appropriate affect    LABS AND  TESTING:  EKG: RSR normal  Labs: reviewed most recent labs  Reviewed outside records    ASSESSMENT AND PLAN:    Chief Complaint   Patient presents with    New Patient     Cardiac evaluation         ICD-10-CM ICD-9-CM    1. Chest pain at rest  R07.9 786.50       2. Class 2 severe obesity due to excess calories with serious comorbidity in adult, unspecified BMI (HCC)  E66.01 278.01         I asked the patient to reduce salt intake try to lose weight have blood test should check thyroid function and D-dimer levels have an echocardiographic study and then event monitor for 2 weeks  Thank you very much for this consultation, I will keep you updated with the results of the above testing.    Sincerely,    Barrett Henle, MD       This report was created using a voice recognition software program. Minor grammatical errors are  common. Please call me if you have any concerns, or if any clarification is needed.

## 2021-06-12 NOTE — Telephone Encounter (Signed)
Spoke to pt she states [employer] will be mailing her new leads for sensitive skin

## 2021-06-12 NOTE — Telephone Encounter (Signed)
I'm wearing a Monitor and I am having some issues. So I called the company and they said to ask you if you had any sensitive skin Patches ?  Call Patient

## 2021-06-13 ENCOUNTER — Encounter

## 2021-06-26 ENCOUNTER — Ambulatory Visit: Admit: 2021-06-26 | Discharge: 2021-06-26 | Payer: PRIVATE HEALTH INSURANCE | Primary: Family

## 2021-06-26 ENCOUNTER — Ambulatory Visit

## 2021-06-26 DIAGNOSIS — R079 Chest pain, unspecified: Secondary | ICD-10-CM

## 2021-06-26 LAB — ECHO (TTE) COMPLETE (PRN CONTRAST/BUBBLE/STRAIN/3D)
AV Area by Peak Velocity: 1.8 cm2
AV Peak Gradient: 16 mmHg
AV Peak Velocity: 2 m/s
AV Velocity Ratio: 0.55
AV Velocity Ratio: 0.56
AVA/BSA Peak Velocity: 0.8 cm2/m2
Ao Root Index: 1.38 cm/m2
Aortic Root: 3.3 cm
Ascending Aorta Index: 1.09 cm/m2
Ascending Aorta: 2.6 cm
EF BP: 71 % (ref 55–100)
Fractional Shortening 2D: 32 % (ref 28–44)
Fractional Shortening 2D: 32.1844 %
IVC Sniffing: 1.5 cm
IVSd: 1 cm — AB (ref 0.6–0.9)
LA Area 2C: 23.4 cm2
LA Area 4C: 26.3 cm2
LA Major Axis: 4.7 cm
LA Volume A-L A4C: 74 mL — AB (ref 22–52)
LA Volume A-L A4C: 95 mL — AB (ref 22–52)
LA Volume Index A-L A2C: 31 mL/m2 (ref 16–34)
LA Volume Index A-L A4C: 40 mL/m2 — AB (ref 16–34)
LV EDV A2C: 105 mL
LV EDV A4C: 118 mL
LV EDV BP: 117 mL — AB (ref 56–104)
LV EDV Index A2C: 44 mL/m2
LV EDV Index A4C: 49 mL/m2
LV EDV Index BP: 49 mL/m2
LV EDV Index Teich: 0 mL/m2
LV EDV Teich: 1 mL
LV ESV A2C: 30.1834 mL
LV ESV A2C: 31 mL
LV ESV A4C: 32.552 mL
LV ESV A4C: 38 mL
LV ESV BP: 34 mL (ref 19–49)
LV ESV Index A2C: 13 mL/m2
LV ESV Index A4C: 16 mL/m2
LV ESV Index BP: 14 mL/m2
LV ESV Index Teich: 0 mL/m2
LV ESV Teich: 0 mL
LV ESV Teich: 28.8122 mL
LV Ejection Fraction A2C: 71 %
LV Ejection Fraction A4C: 68 %
LV IVRT: 73.9 ms
LV Mass 2D Index: 76.1 g/m2 (ref 43–95)
LV Mass 2D: 182 g — AB (ref 67–162)
LV RWT Ratio: 0.4
LVIDd Index: 2.09 cm/m2
LVIDd: 5 cm (ref 3.9–5.3)
LVIDs Index: 1.42 cm/m2
LVIDs: 3.4 cm
LVOT Area: 3.1 cm2
LVOT Diameter: 2 cm
LVOT Peak Gradient: 5 mmHg
LVOT Peak Velocity: 1.1 m/s
LVPWd: 1 cm — AB (ref 0.6–0.9)
Left Ventricular Stroke Volume by 2-D Biplane-MOD: 33.5999 mL
MV A Velocity: 0.86 m/s
MV E Velocity: 0.91 m/s
MV E Wave Deceleration Time: 218.7 ms
MV E/A: 1.06
Mitral Valve E-F Slope by M-mode: 4.1781
TR Max Velocity: 2.57 m/s
TR Peak Gradient: 26 mmHg

## 2021-06-26 LAB — ECHO ADULT COMPLETE
AV Area by Peak Velocity: 1.8 cm2
AV Peak Gradient: 16 mmHg
AV Peak Velocity: 2 m/s
AV Velocity Ratio: 0.55
AV Velocity Ratio: 0.56
AVA/BSA Peak Velocity: 0.8 cm2/m2
Ao Root Index: 1.38 cm/m2
Ascending Aorta Index: 1.09 cm/m2
Ascending Aorta: 2.6 cm
EF BP: 71 % (ref 55–100)
Fractional Shortening 2D: 32 % (ref 28–44)
IVC Sniffing: 1.5 cm
IVSd: 1 cm — AB (ref 0.6–0.9)
LA Area 2C: 23.4 cm2
LA Area 4C: 26.3 cm2
LA Major Axis: 4.7 cm
LA Volume 2C: 74 mL — AB (ref 22–52)
LA Volume 4C: 95 mL — AB (ref 22–52)
LA Volume Index 2C: 31 mL/m2 (ref 16–34)
LA Volume Index 4C: 40 mL/m2 — AB (ref 16–34)
LV EDV A2C: 105 mL
LV EDV A4C: 118 mL
LV EDV BP: 117 mL — AB (ref 56–104)
LV EDV Index A2C: 44 mL/m2
LV EDV Index A4C: 49 mL/m2
LV EDV Index BP: 49 mL/m2
LV EDV Index Teich: 0 mL/m2
LV EDV Teich: 1 mL
LV ESV A2C: 31 mL
LV ESV A4C: 38 mL
LV ESV BP: 34 mL (ref 19–49)
LV ESV Index A2C: 13 mL/m2
LV ESV Index A4C: 16 mL/m2
LV ESV Index BP: 14 mL/m2
LV ESV Index Teich: 0 mL/m2
LV ESV Teich: 0 mL
LV Ejection Fraction A2C: 71 %
LV Ejection Fraction A4C: 68 %
LV IVRT: 73.9 ms
LV Mass 2D Index: 76.1 g/m2 (ref 43–95)
LV Mass 2D: 182 g — AB (ref 67–162)
LV RWT Ratio: 0.4
LVIDd Index: 2.09 cm/m2
LVIDd: 5 cm (ref 3.9–5.3)
LVIDs Index: 1.42 cm/m2
LVIDs: 3.4 cm
LVOT Area: 3.1 cm2
LVOT Diameter: 2 cm
LVOT Peak Gradient: 5 mmHg
LVOT Peak Velocity: 1.1 m/s
LVPWd: 1 cm — AB (ref 0.6–0.9)
Left Ventricular Fractional Shortening by 2D: 32.1844 %
Left Ventricular Stroke Volume by 2-D Biplane-MOD: 33.5999 mL
Left Ventricular Stroke Volume by 2-D Single Plane- MOD: 30.1834 mL
Left Ventricular Stroke Volume by 2-D Single Plane- MOD: 32.552 mL
Left Ventricular Stroke Volume by Teichholz Method: 28.8122 mL
MV A Velocity: 0.86 m/s
MV E Velocity: 0.91 m/s
MV E Wave Deceleration Time: 218.7 ms
MV E/A: 1.06
Mitral Valve Deceleration Slope: 4.1781
TR Max Velocity: 2.57 m/s
TR Peak Gradient: 26 mmHg

## 2021-07-04 ENCOUNTER — Encounter: Attending: Cardiovascular Disease | Primary: Family

## 2021-07-08 MED ORDER — LIDOCAINE HCL 2 % (20 MG/ML) IJ SOLN
20 mg/mL (2 %) | INTRAMUSCULAR | Status: AC
Start: 2021-07-08 — End: ?

## 2021-07-08 MED ORDER — MIDAZOLAM 1 MG/ML IJ SOLN
1 mg/mL | INTRAMUSCULAR | Status: DC | PRN
Start: 2021-07-08 — End: 2021-07-08
  Administered 2021-07-08: 14:00:00 via INTRAVENOUS

## 2021-07-08 MED ORDER — LACTATED RINGERS IV
INTRAVENOUS | Status: DC
Start: 2021-07-08 — End: 2021-07-08
  Administered 2021-07-08: 13:00:00 via INTRAVENOUS

## 2021-07-08 MED ORDER — PROPOFOL 10 MG/ML IV EMUL
10 mg/mL | INTRAVENOUS | Status: AC
Start: 2021-07-08 — End: ?

## 2021-07-08 MED ORDER — MIDAZOLAM 1 MG/ML IJ SOLN
1 mg/mL | INTRAMUSCULAR | Status: AC
Start: 2021-07-08 — End: ?

## 2021-07-08 MED ORDER — PROPOFOL 10 MG/ML IV EMUL
10 mg/mL | INTRAVENOUS | Status: DC | PRN
Start: 2021-07-08 — End: 2021-07-08
  Administered 2021-07-08 (×5): via INTRAVENOUS

## 2021-07-08 MED FILL — LIDOCAINE HCL 2 % (20 MG/ML) IJ SOLN: 20 mg/mL (2 %) | INTRAMUSCULAR | Qty: 20

## 2021-07-08 MED FILL — DIPRIVAN 10 MG/ML INTRAVENOUS EMULSION: 10 mg/mL | INTRAVENOUS | Qty: 40

## 2021-07-08 MED FILL — LACTATED RINGERS IV: INTRAVENOUS | Qty: 1000

## 2021-07-08 MED FILL — DIPRIVAN 10 MG/ML INTRAVENOUS EMULSION: 10 mg/mL | INTRAVENOUS | Qty: 90

## 2021-07-08 MED FILL — MIDAZOLAM 1 MG/ML IJ SOLN: 1 mg/mL | INTRAMUSCULAR | Qty: 2

## 2021-07-08 NOTE — Procedures (Signed)
Procedures by Thyra Breed, MD at 07/08/21 1035                Author: Thyra Breed, MD  Service: Gastroenterology  Author Type: Physician       Filed: 07/08/21 1038  Date of Service: 07/08/21 1035  Status: Signed          Editor: Thyra Breed, MD (Physician)                             Operative Report          Patient: Michelle Matthews  MRN: [id number]   SSN: [id number]          Date of Birth: 1965/04/11   Age: 56 y.o.   Sex: female          Date of Surgery: 07/08/2021      Preoperative Diagnosis: History of colon polyps [Z86.010]   Family history of colon cancer [Z80.0]   Other constipation [K59.09]       Postoperative Diagnosis: diverticulosis, colon polyps       Surgeon(s) and Role:      * Samule Life, MD - Primary      Anesthesia: MAC       Procedure: Procedure(s):   COLONOSCOPY, ileoscopy, polypectomy    Indication: hx of colon polyps, family hx of colon cancer   Procedure in Detail: the endoscope was advanced to the cecum, verified by visualization of the ileocecal valve and the appendiceal orifice.   The terminal ileum was intubated for a short distance with endoscopically normal mucosa seen.  The following polyps were resected with the jumbo biopsy forceps:      Descending: 71m    Sigmoid: 245m     Scattered left sided diverticula were present. Small internal hemorrhoids were seen on retroflexion in the rectum. The bowel prep was good.       Assessment: 1. S/p resection of two small polyps                         2. Diverticulosis                         .3. small internal hemorrhoids      Plan: 1 follow up biopsy results.          Estimated Blood Loss:  negligible.       Tourniquet Time: * No tourniquets in log *        Implants: * No implants in log *              Specimens:            ID  Type  Source  Tests  Collected by  Time  Destination     A : descending colon polyp  Preservative  Colon, Descending    Dylan Monforte, MD  07/08/2021 10:28 AM  Pathology              B : sigmoid colon polyp  Preservative  Sigmoid    Ransom Nickson, MD  07/08/2021 10:29 AM  Pathology               Drains: None                  Complications: None      Counts: Sponge and needle counts were correct times two.  Signed By:   Thyra Breed, MD           Jul 08, 2021

## 2021-07-08 NOTE — H&P (Signed)
56 yr old female with family hx of colon cancer.. pt with hx of colon polyps.  See notes for details   Vs reviewed  Heent; perl, anicteric   Heart: +s1s2rr  Lungs; cta b/l  Abd: +bs,soft, nt/nd    Plan; colonoscopy     Previous h and p from 05/2021 as follows; "  56 yr old female with family hx of colon cancer.. pt with hx of colon polyps, told were large and to repeat soon.  Last colonoscopy was around end of 2020.  No n/v/d. No melena or hematochezia.  Hx of lap band surgery and uses linzess for constipation. No gerd. No dysphagia or odynophagia. No cp. Recent sob from respiratory infection but improving. No abd pain     There is no problem list on file for this patient.           No past medical history on file.  No past surgical history on file.  Not on File     No family history on file.  Social History            Socioeconomic History    Marital status: UNKNOWN       Spouse name: Not on file    Number of children: Not on file    Years of education: Not on file    Highest education level: Not on file   Occupational History    Not on file   Tobacco Use    Smoking status: Not on file    Smokeless tobacco: Not on file   Substance and Sexual Activity    Alcohol use: Not on file    Drug use: Not on file    Sexual activity: Not on file   Other Topics Concern    Not on file   Social History Narrative    Not on file      Social Determinants of Health      Financial Resource Strain: Not on file   Food Insecurity: Not on file   Transportation Needs: Not on file   Physical Activity: Not on file   Stress: Not on file   Social Connections: Not on file   Intimate Partner Violence: Not on file   Housing Stability: Not on file         Visit Vitals  BP 136/86   Pulse 84   Temp 98.3 F (36.8 C)   Ht 5\' 11"  (1.803 m)   Wt 268 lb (121.6 kg)   SpO2 98%   BMI 37.38 kg/m         REVIEW OF SYSTEMS:  Review of Systems   Cardiovascular:  Negative for chest pain.   Gastrointestinal:  Negative for abdominal pain, nausea and vomiting.          Objective:      PHYSICAL EXAM:  Physical Exam  Constitutional:       Appearance: She is well-developed.   HENT:      Head: Normocephalic and atraumatic.   Eyes:      General: No scleral icterus.     Conjunctiva/sclera: Conjunctivae normal.      Pupils: Pupils are equal, round, and reactive to light.   Neck:      Thyroid: No thyromegaly.   Cardiovascular:      Rate and Rhythm: Normal rate and regular rhythm.      Heart sounds: Normal heart sounds. No murmur heard.  Pulmonary:      Effort: Pulmonary effort is normal. No respiratory  distress.      Breath sounds: Normal breath sounds. No wheezing or rales.   Abdominal:      General: Bowel sounds are normal. There is no distension.      Palpations: Abdomen is soft. There is no mass.      Tenderness: There is no abdominal tenderness. There is no guarding or rebound.   Musculoskeletal:      Cervical back: Neck supple.   Skin:     General: Skin is warm and dry.   Neurological:      Mental Status: She is alert and oriented to person, place, and time.   Psychiatric:         Mood and Affect: Mood normal.         Behavior: Behavior normal.            No results found for this or any previous visit.      Assessment/Plan:   Discussed the patient's BMI with her.  I have recommended the following interventions: dietary management education, guidance, and counseling.       The patient was counseled on the dangers of tobacco use.      I attest that current meds have been reviewed and are accurate.    56 yr old female with hx of colon polyps, family hx of colon cancer  --schedule colonoscopy -take linzess daily for 5 days prior to colonoscopy.

## 2021-07-08 NOTE — Anesthesia Post-Procedure Evaluation (Signed)
Procedure(s):  COLONOSCOPY, ileoscopy, polypectomy.    MAC    Anesthesia Post Evaluation      Multimodal analgesia: multimodal analgesia used between 6 hours prior to anesthesia start to PACU discharge  Patient location during evaluation: PACU  Patient participation: complete - patient participated  Level of consciousness: awake  Pain management: adequate  Airway patency: patent  Anesthetic complications: no  Cardiovascular status: acceptable  Respiratory status: acceptable  Hydration status: acceptable  Comments: Pt doing well w/o complaint  Area of IV infiltration resolved        INITIAL Post-op Vital signs:   Vitals Value Taken Time   BP 124/74 07/08/21 1100   Temp 36.4 C (97.6 F) 07/08/21 1040   Pulse 54 07/08/21 1101   Resp 12 07/08/21 1101   SpO2 95 % 07/08/21 1101   Vitals shown include unvalidated device data.

## 2021-07-08 NOTE — Anesthesia Pre-Procedure Evaluation (Signed)
Anesthesia Preprocedure Evaluation by Beverlee Nims, MD at 07/08/21 1005                Author: Beverlee Nims, MD  Service: ANESTHESIOLOGY  Author Type: Physician       Filed: 07/08/21 1005  Date of Service: 07/08/21 1005  Status: Signed          Editor: Beverlee Nims, MD (Physician)                 Relevant Problems       No relevant active problems           Anesthetic History    No history of anesthetic complications                 Review of Systems / Medical History   Patient summary reviewed          Pulmonary   Within defined limits                          Neuro/Psych    Within defined limits                 Cardiovascular   Within defined limits                        Exercise tolerance: >4 METS            GI/Hepatic/Renal   Within defined limits                     Endo/Other         Hypothyroidism   Morbid obesity        Other Findings                     Physical Exam          Airway   Mallampati: II   TM Distance: 4 - 6 cm   Neck ROM: normal range of motion    Mouth opening: Normal        Cardiovascular   Regular rate and rhythm,  S1 and S2 normal,  no murmur, click, rub, or gallop                    Dental   No notable dental hx               Pulmonary   Breath sounds clear to auscultation                       Abdominal    GI exam deferred           Other Findings                  Anesthetic Plan      ASA: 2   Anesthesia type: MAC               Induction: Intravenous   Anesthetic plan and risks discussed with: Patient

## 2021-07-10 ENCOUNTER — Ambulatory Visit
Admit: 2021-07-10 | Discharge: 2021-07-10 | Payer: PRIVATE HEALTH INSURANCE | Attending: Cardiovascular Disease | Primary: Family

## 2021-07-10 ENCOUNTER — Ambulatory Visit: Attending: Cardiovascular Disease | Primary: Family

## 2021-07-10 DIAGNOSIS — R0609 Other forms of dyspnea: Secondary | ICD-10-CM

## 2021-07-10 NOTE — Progress Notes (Signed)
07/10/2021     Patient:  Michelle Matthews   DOB:  1965-07-09       CHIEF COMPLAINT:   Chief Complaint   Patient presents with    Hypertension    Results         HISTORY OF PRESENT ILLNESS: Michelle Matthews presents for cardiology follow up.  Labile hypertension follow-up on test results    PAST MEDICAL HISTORY:  Past Medical History:   Diagnosis Date    Chest pain at rest 06/06/2021    Class 2 obesity in adult 06/06/2021    Constipation     Dyspnea on exertion 06/06/2021    H/O laparoscopic adjustable gastric banding     Hypothyroid     Palpitation 06/06/2021        PAST SURGICAL HISTORY:  Past Surgical History:   Procedure Laterality Date    COLONOSCOPY N/A 07/08/2021    COLONOSCOPY, ileoscopy, polypectomy performed by Verdie Drown, MD at [facility] MAIN OR    HX BARIATRIC SURGERY      lap band placement    HX CHOLECYSTECTOMY      HX COLONOSCOPY      HX PARTIAL HYSTERECTOMY         MEDICATIONS:  Current Outpatient Medications   Medication Sig Dispense Refill    metFORMIN (GLUCOPHAGE) 500 mg tablet TAKE 1 TABLET BY MOUTH EVERY DAY WITH A MEAL FOR 90 DAYS      Synthroid 125 mcg tablet 1 Tablet Daily (before breakfast).      Linzess 290 mcg cap capsule TAKE 1 CAPSULE BY MOUTH EVERY MORNING AS DIRECTED 30 MIN BEFORE BREAKFAST X 90 DAYS      traZODone (DESYREL) 50 mg tablet Take 1 Tablet by mouth as needed.            ALLERGIES:   No Known Allergies    SOCIAL HISTORY:  Social History     Socioeconomic History    Marital status: MARRIED   Tobacco Use    Smoking status: Former     Types: Cigarettes     Quit date: 2013     Years since quitting: 10.3    Smokeless tobacco: Never   Vaping Use    Vaping Use: Never used   Substance and Sexual Activity    Alcohol use: Not Currently    Drug use: Not Currently         FAMILY HISTORY:  Family History   Problem Relation Age of Onset    Hypertension Mother     Diabetes Mother     Breast Cancer Mother     Colon Cancer Mother     Thyroid Disease Father        REVIEW OF SYSTEMS:  Neuro: No headache,  focal weakness/numbness, parasthesias  Eyes: No visual disturbance  ENMT: No soar throat, nasal congestion, hearing loss, tinnitus  CV: No chest pain, palpitations, dizziness, syncope, claudication  Resp: No dyspnea, cough  GI: No abdominal pain, N/V, diarrhea, constipation  GU: No hematuria, dysuria  Skin: No rash  Heme: No bleeding, bruising  Constitutional: No fatigue, weakness, fever, weight change  Musculoskeletal: No myalgias, arthralgias  Other:    PHYSICAL EXAM:  Blood pressure 138/84, pulse 77, height 5\' 11"  (1.803 m), weight 270 lb (122.5 kg), SpO2 97 %.   General: Well developed, well nourished, no acute distress, appears stated age  Eyes: Anicteric, no hemorrhage, normal appearing conjunctivae and lids  ENMT: Normal externally  Neck: Supple, no JVD, no carotid bruit  CV:  RRR, normal S1S2, no M/G/R  Lungs: CTA bilaterally, normal respiratory excursion  Abd: Soft, NT, ND, no palpable mass  Extremities: No pedal edema  Skin: Warm and dry  Musculoskeletal: Normal muscle tone, normal gait  Neuro: Nonfocal exam, no rigidity/tremor  Psych: Alert and oriented, appropriate affect    LABS AND TESTING:  Echocardiographic study was normal    Event monitor showed 1 day of PVCs in bigeminal form    ASSESSMENT AND PLAN:    Occasional PVCs follow-up as needed    Barrett Henle, MD       This report was created using a voice recognition software program. Minor grammatical errors are common. Please call me if you have any concerns, or if any clarification is needed.

## 2021-07-10 NOTE — Progress Notes (Signed)
Operative Report     Patient: Michelle Matthews MRN: [id number]  SSN: [id number]    Date of Birth: 03/04/1966  Age: 56 y.o.  Sex: female       Date of Surgery: 07/08/2021     Preoperative Diagnosis: History of colon polyps [Z86.010]  Family history of colon cancer [Z80.0]  Other constipation [K59.09]      Postoperative Diagnosis: diverticulosis, colon polyps      Surgeon(s) and Role:     * Nori Poland, MD - Primary     Anesthesia: MAC      Procedure: Procedure(s):  COLONOSCOPY, ileoscopy, polypectomy   Indication: hx of colon polyps, family hx of colon cancer  Procedure in Detail: the endoscope was advanced to the cecum, verified by visualization of the ileocecal valve and the appendiceal orifice.  The terminal ileum was intubated for a short distance with endoscopically normal mucosa seen.  The following polyps were resected with the jumbo biopsy forceps:     Descending: 10m   Sigmoid: 239m    Scattered left sided diverticula were present. Small internal hemorrhoids were seen on retroflexion in the rectum. The bowel prep was good.      Assessment: 1. S/p resection of two small polyps                        2. Diverticulosis                        .3. small internal hemorrhoids     Plan: 1 follow up biopsy results.    ------------------------------------------------------------------------------------------------------  Bx:  A.          Final Pathologic Diagnosis: Colon, ascending, polyp,   polypectomy:        Sessile serrated polyp.     B.          Colon, sigmoid, polyp, polypectomy:        Colonic mucosa with prominent lymphoid aggregate.       Will repeat colonoscopy in 5 years.

## 2021-07-10 NOTE — Progress Notes (Signed)
 Progress  Notes by Keren Frieze C at 07/10/21 1704                Author: Keren Frieze C  Service: --  Author Type: --       Filed: 07/12/21 1345  Encounter Date: 07/10/2021  Status: Signed          Editor: Keren Frieze BROCKS               LMOM FOR PT TO CALL FOR RESULTS    RECALL CREATED 5 YEARS COLONOSCOPY

## 2021-07-10 NOTE — Progress Notes (Signed)
 Progress  Notes by Keren Frieze C at 07/10/21 1704                Author: Keren Frieze C  Service: --  Author Type: --       Filed: 07/12/21 1441  Encounter Date: 07/10/2021  Status: Signed          Editor: Keren Frieze C               Results discussed with pt as per dr. Andria
